# Patient Record
Sex: Male | Born: 1935 | Race: White | Hispanic: No | Smoking: Never smoker
Health system: Southern US, Community
[De-identification: ages and names within clinical notes are randomized; demographics above are authoritative.]

## PROBLEM LIST (undated history)

## (undated) DIAGNOSIS — R32 Unspecified urinary incontinence: Secondary | ICD-10-CM

## (undated) DIAGNOSIS — J449 Chronic obstructive pulmonary disease, unspecified: Secondary | ICD-10-CM

## (undated) DIAGNOSIS — N400 Enlarged prostate without lower urinary tract symptoms: Secondary | ICD-10-CM

## (undated) DIAGNOSIS — Z9109 Other allergy status, other than to drugs and biological substances: Secondary | ICD-10-CM

## (undated) DIAGNOSIS — R413 Other amnesia: Secondary | ICD-10-CM

## (undated) DIAGNOSIS — I10 Essential (primary) hypertension: Secondary | ICD-10-CM

## (undated) HISTORY — PX: HERNIA REPAIR: SHX51

## (undated) HISTORY — PX: PROSTATECTOMY: SHX69

---

## 2011-10-20 ENCOUNTER — Emergency Department: Admit: 2011-10-20 | Discharge: 2011-10-20 | Disposition: A | Discharge status: Home / Self Care | Payer: Medicare Other

## 2011-10-20 ENCOUNTER — Emergency Department (INDEPENDENT_AMBULATORY_CARE_PROVIDER_SITE_OTHER)
Admission: EM | Admit: 2011-10-20 | Discharge: 2011-10-20 | Disposition: A | Discharge status: Home / Self Care | Payer: Medicare Other | Source: Home / Self Care | Attending: Emergency Medicine | Admitting: Emergency Medicine

## 2011-10-20 DIAGNOSIS — M25531 Pain in right wrist: Secondary | ICD-10-CM

## 2011-10-20 DIAGNOSIS — M25539 Pain in unspecified wrist: Secondary | ICD-10-CM

## 2011-10-20 HISTORY — DX: Other allergy status, other than to drugs and biological substances: Z91.09

## 2011-10-20 HISTORY — DX: Chronic obstructive pulmonary disease, unspecified: J44.9

## 2011-10-20 HISTORY — DX: Essential (primary) hypertension: I10

## 2011-10-20 MED ORDER — CEPHALEXIN 500 MG PO CAPS: 500.0000 mg | ORAL_CAPSULE | Freq: Three times a day (TID) | ORAL | Status: AC

## 2011-10-20 NOTE — ED Provider Notes (Signed)
History     CSN: [id number]  Arrival date & time 10/20/11  1200   First MD Initiated Contact with Patient 10/20/11 1243      Chief Complaint  Patient presents with  . Hand Pain    (Consider location/radiation/quality/duration/timing/severity/associated sxs/prior treatment) HPI This is a right-handed 78 year old male with a history of osteoarthritis who comes in complaining of right wrist pain, swelling, and redness. His daughter is present with him. He does not recall any injuries or falls. He does not recall any bites or other trauma. He states that he has always had some arthritis pain in his fingers and other joints. However this swelling has increased over the last 2 days. He is taking Tylenol which is helping. He describes as sore and stiff.  No fever, chills, N/V.  Past Medical History  Diagnosis Date  . COPD (chronic obstructive pulmonary disease)   . Hypertension   . Environmental allergies     No past surgical history on file.  No family history on file.  History  Substance Use Topics  . Smoking status: Not on file  . Smokeless tobacco: Not on file  . Alcohol Use:       Review of Systems  All other systems reviewed and are negative.    Allergies  Tetanus toxoids  Home Medications   Current Outpatient Rx  Name Route Sig Dispense Refill  . IPRATROPIUM-ALBUTEROL 18-103 MCG/ACT IN AERO Inhalation Inhale 2 puffs into the lungs every 6 (six) hours as needed.    Marland Kitchen AMITRIPTYLINE HCL 10 MG PO TABS Oral Take 10 mg by mouth at bedtime.    Marland Kitchen BRIMONIDINE TARTRATE 0.1 % OP SOLN      . FLUTICASONE-SALMETEROL 115-21 MCG/ACT IN AERO Inhalation Inhale 2 puffs into the lungs 2 (two) times daily.    Marland Kitchen MONTELUKAST SODIUM 10 MG PO TABS Oral Take 10 mg by mouth at bedtime.    . TRIAMTERENE-HCTZ 37.5-25 MG PO CAPS Oral Take 1 capsule by mouth every morning.    . CEPHALEXIN 500 MG PO CAPS Oral Take 1 capsule (500 mg total) by mouth 3 (three) times daily. 21 capsule 0    BP  178/78  Pulse 83  Temp(Src) 97.8 F (36.6 C) (Oral)  Resp 24  Ht 4\' 11"  (1.499 m)  Wt 148 lb 8 oz (67.359 kg)  BMI 29.99 kg/m2  SpO2 94%  Physical Exam  Nursing note and vitals reviewed. Constitutional: He is oriented to person, place, and time. He appears well-developed and well-nourished.  HENT:  Head: Normocephalic and atraumatic.  Eyes: No scleral icterus.  Neck: Neck supple.  Cardiovascular: Regular rhythm and normal heart sounds.   Pulmonary/Chest: Effort normal and breath sounds normal. No respiratory distress.  Musculoskeletal:       Right wrist examination demonstrates full range of motion with flexion, extension, supination and pronation. He does have some warmth and mild swelling and tenderness at the wrist joint itself. No snuffbox tenderness. No deformity seen. Distal neurovascular status is intact. He does have some osteoarthritic bony changes in that hand.  Neurological: He is alert and oriented to person, place, and time.  Skin: Skin is warm and dry.  Psychiatric: He has a normal mood and affect. His speech is normal.    ED Course  Procedures (including critical care time)  Labs Reviewed - No data to display Dg Wrist Complete Right  10/20/2011  *RADIOLOGY REPORT*  Clinical Data: 78 year old male with right wrist pain and swelling.  Limited movement.  RIGHT WRIST - COMPLETE 3+ VIEW  Comparison: None  Findings: There is no evidence of acute fracture, subluxation or dislocation. Degenerative changes at the radiocarpal joint and scaphoid- trapezium articulation noted. Moderate to heavy chondrocalcinosis identified within the wrist, triangular fibrocartilage and at the first carpometacarpal joint. There is no evidence of bony erosion.  IMPRESSION: No evidence of acute abnormality.  Chondrocalcinosis/CPPD.  Degenerative changes within the wrist as described.  Original Report Authenticated By: Rosendo Gros, M.D.     1. Right wrist pain       MDM   An x-ray is  obtained and read by the radiologist as above.  Differential diagnosis includes gout, cellulitis, or other injury. To be safe at his age, I'm going to give him a prescription for Keflex. I would like him to followup with his primary care physician this week. If it is getting worse in terms of joint stiffness or redness, he may need to go see an orthopedist instead.  Encourage rest, ice, compression with ACE bandage and/or a brace, and elevation of injured body part.  At his age, I prefer Tylenol and avoid NSAIDs.       Marlaine Hind, MD 10/20/11 1335

## 2011-10-20 NOTE — ED Notes (Signed)
Right hand pain and swelling started Friday night

## 2012-03-08 ENCOUNTER — Emergency Department (HOSPITAL_COMMUNITY): Payer: Medicare Other

## 2012-03-08 ENCOUNTER — Emergency Department (HOSPITAL_COMMUNITY)
Admission: EM | Admit: 2012-03-08 | Discharge: 2012-03-08 | Disposition: A | Discharge status: Home / Self Care | Payer: Medicare Other | Attending: Emergency Medicine | Admitting: Emergency Medicine

## 2012-03-08 ENCOUNTER — Encounter (HOSPITAL_COMMUNITY): Payer: Self-pay | Admitting: *Deleted

## 2012-03-08 DIAGNOSIS — R109 Unspecified abdominal pain: Secondary | ICD-10-CM | POA: Insufficient documentation

## 2012-03-08 DIAGNOSIS — J4489 Other specified chronic obstructive pulmonary disease: Secondary | ICD-10-CM | POA: Insufficient documentation

## 2012-03-08 DIAGNOSIS — N419 Inflammatory disease of prostate, unspecified: Secondary | ICD-10-CM | POA: Insufficient documentation

## 2012-03-08 DIAGNOSIS — I10 Essential (primary) hypertension: Secondary | ICD-10-CM | POA: Insufficient documentation

## 2012-03-08 DIAGNOSIS — Z9109 Other allergy status, other than to drugs and biological substances: Secondary | ICD-10-CM | POA: Insufficient documentation

## 2012-03-08 DIAGNOSIS — J449 Chronic obstructive pulmonary disease, unspecified: Secondary | ICD-10-CM | POA: Insufficient documentation

## 2012-03-08 DIAGNOSIS — R509 Fever, unspecified: Secondary | ICD-10-CM | POA: Insufficient documentation

## 2012-03-08 DIAGNOSIS — R110 Nausea: Secondary | ICD-10-CM | POA: Insufficient documentation

## 2012-03-08 DIAGNOSIS — Z79899 Other long term (current) drug therapy: Secondary | ICD-10-CM | POA: Insufficient documentation

## 2012-03-08 LAB — LACTIC ACID, PLASMA: Lactic Acid, Venous: 1.6 mmol/L (ref 0.5–2.2)

## 2012-03-08 LAB — CBC WITH DIFFERENTIAL/PLATELET
Basophils Absolute: 0 10*3/uL (ref 0.0–0.1)
Basophils Relative: 0 % (ref 0–1)
HCT: 43.7 % (ref 39.0–52.0)
Hemoglobin: 15.2 g/dL (ref 13.0–17.0)
Lymphocytes Relative: 3 % — ABNORMAL LOW (ref 12–46)
MCHC: 34.8 g/dL (ref 30.0–36.0)
Monocytes Absolute: 0.9 10*3/uL (ref 0.1–1.0)
Monocytes Relative: 7 % (ref 3–12)
Neutro Abs: 11.6 10*3/uL — ABNORMAL HIGH (ref 1.7–7.7)
Neutrophils Relative %: 90 % — ABNORMAL HIGH (ref 43–77)
RBC: 4.87 MIL/uL (ref 4.22–5.81)
WBC: 12.9 10*3/uL — ABNORMAL HIGH (ref 4.0–10.5)

## 2012-03-08 LAB — URINALYSIS, ROUTINE W REFLEX MICROSCOPIC
Glucose, UA: NEGATIVE mg/dL
Leukocytes, UA: NEGATIVE
Nitrite: NEGATIVE
Specific Gravity, Urine: 1.012 (ref 1.005–1.030)
pH: 7.5 (ref 5.0–8.0)

## 2012-03-08 LAB — POCT I-STAT, CHEM 8
BUN: 16 mg/dL (ref 6–23)
Calcium, Ion: 1.19 mmol/L (ref 1.13–1.30)
Chloride: 101 mEq/L (ref 96–112)
Glucose, Bld: 143 mg/dL — ABNORMAL HIGH (ref 70–99)
HCT: 47 % (ref 39.0–52.0)
Potassium: 3.5 mEq/L (ref 3.5–5.1)

## 2012-03-08 MED ORDER — SODIUM CHLORIDE 0.9 % IV BOLUS (SEPSIS)
500.0000 mL | Freq: Once | INTRAVENOUS | Status: AC
  Administered 2012-03-08: 500 mL via INTRAVENOUS

## 2012-03-08 MED ORDER — ACETAMINOPHEN 325 MG PO TABS
650.0000 mg | ORAL_TABLET | Freq: Once | ORAL | Status: AC
  Administered 2012-03-08: 650 mg via ORAL
  Filled 2012-03-08: qty 2

## 2012-03-08 MED ORDER — DEXTROSE 5 % IV SOLN
1.0000 g | Freq: Once | INTRAVENOUS | Status: AC
  Administered 2012-03-08: 1 g via INTRAVENOUS
  Filled 2012-03-08: qty 10

## 2012-03-08 MED ORDER — CEPHALEXIN 500 MG PO CAPS: 500.0000 mg | ORAL_CAPSULE | Freq: Four times a day (QID) | ORAL | Status: AC

## 2012-03-08 NOTE — ED Notes (Signed)
Daughter advises that he has mild incontinence of bowel and bladder, last BM 03/07/2012. Feeling like he needs to void   with little output

## 2012-03-08 NOTE — ED Notes (Signed)
Daughter at bedside patient denies pain at this time.

## 2012-03-08 NOTE — ED Provider Notes (Signed)
Patient is moved to the CDU after initial evaluation by EDP Wickline. He presented to the ER earlier with lower abdominal pain and fever up to 100.5 as well as urinary frequency with decreased output and decreased activity.  On my exam, patient is awake alert and in no distress. Course breath sounds. Regular rate and rhythm. Abdomen is soft, nontender. Moves all extremities well.  Per initial provider, prostatitis suspected as cause of symptoms. Has had good improvement after placement of foley catheter. Afebrile without tachycardia on VS recheck.  Pt is ambulatory in ED with minimal assistance, at baseline per daughter. He appears ready for d/c home. Foley catheter will remain in place. Keflex will be given per discussion with EDP and pt is advised to f/u with urology and PCP. Return precautions discussed.  Shaaron Adler, [place] 03/08/12 1958

## 2012-03-08 NOTE — ED Notes (Signed)
Pt ambulatory with standby assistance, Witnessed by PA.

## 2012-03-08 NOTE — ED Notes (Signed)
Brought ion to D 36. Placed on stretcher

## 2012-03-08 NOTE — ED Notes (Signed)
Patient with reported decreased urine output.  He reports onset of lower abd pain that is worse today with fever and shakiness.  He is also reporting nausea.  Patient has fever in triage of 100.0

## 2012-03-08 NOTE — ED Notes (Signed)
Foley Cath inserted per Mosetta Anis Urine is clear yellow and more than 200 cc out initially from [facility]

## 2012-03-08 NOTE — ED Notes (Signed)
As per Foley 500cc of urine

## 2012-03-08 NOTE — ED Provider Notes (Addendum)
History     CSN: [id number]  Arrival date & time 03/08/12  1406   First MD Initiated Contact with Patient 03/08/12 1511      Chief Complaint  Patient presents with  . Abdominal Pain  . Fever  . Nausea  . Urinary Retention     Patient is a 78 y.o. male presenting with abdominal pain. The history is provided by the patient and a relative.  Abdominal Pain The primary symptoms of the illness include abdominal pain and fever. Episode onset: today. The onset of the illness was gradual.  Additional symptoms associated with the illness include chills. Symptoms associated with the illness do not include back pain.  nothing improves symptoms, palpation worsens symptoms  Pt presents with lower abdominal pain and fever up to 100.5 today.  Patient is present with daughter.  She reports he is usually active and ambulatory but today has had fever and not wanting to get around.  He has had urinary frequency for past week with minimal urine output.  No cough noted.  No vomiting/diarrhea/constipation reported.    Past Medical History  Diagnosis Date  . COPD (chronic obstructive pulmonary disease)   . Hypertension   . Environmental allergies     Past Surgical History  Procedure Date  . Prostatectomy   . Hernia repair     No family history on file.  History  Substance Use Topics  . Smoking status: Never Smoker   . Smokeless tobacco: Not on file  . Alcohol Use: No      Review of Systems  Constitutional: Positive for fever and chills.  Gastrointestinal: Positive for abdominal pain.  Musculoskeletal: Negative for back pain.  Neurological: Positive for weakness.  All other systems reviewed and are negative.    Allergies  Tetanus toxoids  Home Medications   Current Outpatient Rx  Name Route Sig Dispense Refill  . IPRATROPIUM-ALBUTEROL 18-103 MCG/ACT IN AERO Inhalation Inhale 2 puffs into the lungs every 6 (six) hours as needed.    Marland Kitchen AMITRIPTYLINE HCL 10 MG PO TABS Oral Take  10 mg by mouth at bedtime.    Marland Kitchen BRIMONIDINE TARTRATE 0.1 % OP SOLN      . FLUTICASONE-SALMETEROL 115-21 MCG/ACT IN AERO Inhalation Inhale 2 puffs into the lungs 2 (two) times daily.    Marland Kitchen MONTELUKAST SODIUM 10 MG PO TABS Oral Take 10 mg by mouth at bedtime.    . TRIAMTERENE-HCTZ 37.5-25 MG PO CAPS Oral Take 1 capsule by mouth every morning.      BP 137/68  Pulse 106  Temp 100 F (37.8 C) (Oral)  Resp 24  SpO2 91%  Physical Exam CONSTITUTIONAL: Well developed/well nourished HEAD AND FACE: Normocephalic/atraumatic EYES: EOMI ENMT: Mucous membranes dry NECK: supple no meningeal signs SPINE:entire spine nontender, No bruising/crepitance/stepoffs noted to spine CV: S1/S2 noted, no murmurs/rubs/gallops noted LUNGS: coarse BS noted in the bases but no distress ABDOMEN: soft, mild suprapubic tenderness noted, no rebound or guarding GU:no cva tenderness.  No hernia noted, no testicular tenderness Rectal - prostate tender to palpation, chaperone present.  Normal rectal tone noted NEURO: Pt is awake/alert, moves all extremitiesx4 EXTREMITIES: pulses normal, full ROM SKIN: warm, color normal PSYCH: no abnormalities of mood noted  ED Course  Procedures Labs Reviewed  URINALYSIS, ROUTINE W REFLEX MICROSCOPIC - Abnormal; Notable for the following:    Hgb urine dipstick SMALL (*)     All other components within normal limits  CBC WITH DIFFERENTIAL - Abnormal; Notable for the following:  WBC 12.9 (*)     Neutrophils Relative 90 (*)     Neutro Abs 11.6 (*)     Lymphocytes Relative 3 (*)     Lymphs Abs 0.4 (*)     All other components within normal limits  POCT I-STAT, CHEM 8 - Abnormal; Notable for the following:    Glucose, Bld 143 (*)     All other components within normal limits  URINE MICROSCOPIC-ADD ON  LACTIC ACID, PLASMA  URINE CULTURE   3:53 PM Pt presents with lower abdominal pain, fever today and urinary frequency.  It is reported he had prostatectomy, but daughter thinks  it was partially removed.  He seems to have some prostate left on exam and he is tender.  Will obtain post void residual.    4:12 PM Pt currently stable.  Will hold in CDU to wait on CXR results, and also see if pain resolves.  If no resolution, will need CT imaging.  Suspect prostatitis Pt with >770ml of urine out foley placement  MDM  Nursing notes including past medical history and social history reviewed and considered in documentation labs/vitals reviewed and considered    Date: 03/08/2012  Rate: 79  Rhythm: normal sinus rhythm  QRS Axis: left  Intervals: normal  ST/T Wave abnormalities: nonspecific ST changes  Conduction Disutrbances:nonspecific intraventricular conduction delay  Narrative Interpretation:   Old EKG Reviewed: none available at time of interpretation        Joya Gaskins, MD 03/08/12 1614  Joya Gaskins, MD 03/08/12 1630

## 2012-03-08 NOTE — ED Notes (Signed)
Patient advises pain all night in the abdomen constant in nature.He denies nausea vomiting diarrhea

## 2012-03-09 NOTE — ED Provider Notes (Signed)
Medical screening examination/treatment/procedure(s) were conducted as a shared visit with non-physician practitioner(s) and myself.  I personally evaluated the patient during the encounter   Joya Gaskins, MD 03/09/12 1504

## 2012-03-10 LAB — URINE CULTURE: Colony Count: 25000

## 2012-09-01 ENCOUNTER — Ambulatory Visit: Payer: Medicare Other | Attending: Family Medicine

## 2012-09-01 DIAGNOSIS — IMO0001 Reserved for inherently not codable concepts without codable children: Secondary | ICD-10-CM | POA: Insufficient documentation

## 2012-09-01 DIAGNOSIS — J4489 Other specified chronic obstructive pulmonary disease: Secondary | ICD-10-CM | POA: Insufficient documentation

## 2012-09-01 DIAGNOSIS — R269 Unspecified abnormalities of gait and mobility: Secondary | ICD-10-CM | POA: Insufficient documentation

## 2012-09-01 DIAGNOSIS — J449 Chronic obstructive pulmonary disease, unspecified: Secondary | ICD-10-CM | POA: Insufficient documentation

## 2012-09-01 DIAGNOSIS — R262 Difficulty in walking, not elsewhere classified: Secondary | ICD-10-CM | POA: Insufficient documentation

## 2012-09-01 DIAGNOSIS — M6281 Muscle weakness (generalized): Secondary | ICD-10-CM | POA: Insufficient documentation

## 2012-09-01 DIAGNOSIS — I10 Essential (primary) hypertension: Secondary | ICD-10-CM | POA: Insufficient documentation

## 2012-09-07 ENCOUNTER — Encounter: Payer: Medicare Other | Admitting: Physical Therapy

## 2012-09-10 ENCOUNTER — Ambulatory Visit: Payer: Medicare Other | Admitting: Physical Therapy

## 2012-11-18 ENCOUNTER — Emergency Department (HOSPITAL_BASED_OUTPATIENT_CLINIC_OR_DEPARTMENT_OTHER)
Admission: EM | Admit: 2012-11-18 | Discharge: 2012-11-18 | Disposition: A | Discharge status: Home / Self Care | Payer: Medicare Other | Attending: Emergency Medicine | Admitting: Emergency Medicine

## 2012-11-18 ENCOUNTER — Encounter (HOSPITAL_BASED_OUTPATIENT_CLINIC_OR_DEPARTMENT_OTHER): Payer: Self-pay

## 2012-11-18 DIAGNOSIS — Z79899 Other long term (current) drug therapy: Secondary | ICD-10-CM | POA: Insufficient documentation

## 2012-11-18 DIAGNOSIS — I10 Essential (primary) hypertension: Secondary | ICD-10-CM | POA: Insufficient documentation

## 2012-11-18 DIAGNOSIS — Y9210 Unspecified residential institution as the place of occurrence of the external cause: Secondary | ICD-10-CM | POA: Insufficient documentation

## 2012-11-18 DIAGNOSIS — Y9301 Activity, walking, marching and hiking: Secondary | ICD-10-CM | POA: Insufficient documentation

## 2012-11-18 DIAGNOSIS — IMO0002 Reserved for concepts with insufficient information to code with codable children: Secondary | ICD-10-CM | POA: Insufficient documentation

## 2012-11-18 DIAGNOSIS — J4489 Other specified chronic obstructive pulmonary disease: Secondary | ICD-10-CM | POA: Insufficient documentation

## 2012-11-18 DIAGNOSIS — Z87448 Personal history of other diseases of urinary system: Secondary | ICD-10-CM | POA: Insufficient documentation

## 2012-11-18 DIAGNOSIS — S0101XA Laceration without foreign body of scalp, initial encounter: Secondary | ICD-10-CM

## 2012-11-18 DIAGNOSIS — S0100XA Unspecified open wound of scalp, initial encounter: Secondary | ICD-10-CM | POA: Insufficient documentation

## 2012-11-18 DIAGNOSIS — J449 Chronic obstructive pulmonary disease, unspecified: Secondary | ICD-10-CM | POA: Insufficient documentation

## 2012-11-18 DIAGNOSIS — W1809XA Striking against other object with subsequent fall, initial encounter: Secondary | ICD-10-CM | POA: Insufficient documentation

## 2012-11-18 DIAGNOSIS — Z8709 Personal history of other diseases of the respiratory system: Secondary | ICD-10-CM | POA: Insufficient documentation

## 2012-11-18 DIAGNOSIS — W010XXA Fall on same level from slipping, tripping and stumbling without subsequent striking against object, initial encounter: Secondary | ICD-10-CM | POA: Insufficient documentation

## 2012-11-18 HISTORY — DX: Benign prostatic hyperplasia without lower urinary tract symptoms: N40.0

## 2012-11-18 HISTORY — DX: Unspecified urinary incontinence: R32

## 2012-11-18 HISTORY — DX: Other amnesia: R41.3

## 2012-11-18 NOTE — ED Notes (Signed)
Pt reports he tripped on step and struck head on door-denies pain and LOC-dsg removed-5cm lac noted-no bleeding at present-cleaned with saline

## 2012-11-18 NOTE — ED Notes (Signed)
[facility] report-pt was walking into restaurant-tripped over step-stuck head on glass door-head lac noted-dsg applied-pt denies pain to head, neck and denied LOC-daughter was with pt at scene-pt lives in an assisted living facility

## 2012-11-18 NOTE — ED Notes (Signed)
MD at bedside. 

## 2012-11-19 NOTE — ED Provider Notes (Signed)
History     CSN: [id number]  Arrival date & time 11/18/12  1305   First MD Initiated Contact with Patient 11/18/12 1328      Chief Complaint  Patient presents with  . Head Injury    (Consider location/radiation/quality/duration/timing/severity/associated sxs/prior treatment) Patient is a 79 y.o. male presenting with head injury. The history is provided by the patient (Pt's daughter). No language interpreter was used.  Head Injury Head/neck injury location: Pt tripped and fell, striking his head on a door and suffering a scalp laceration.  He was not rendered unconsciuous.  There was no other injury. Time since incident:  1 hour Mechanism of injury: fall   Pain details:    Quality: Minimal pain. Relieved by:  Nothing Worsened by:  Nothing tried Associated symptoms: no focal weakness, no nausea, no neck pain, no seizures and no vomiting   Risk factors: being elderly   Risk factors comment:  Not on coumadin or other anticoagulant medications.   Past Medical History  Diagnosis Date  . COPD (chronic obstructive pulmonary disease)   . Hypertension   . Environmental allergies   . BPH (benign prostatic hyperplasia)   . Memory loss   . Urinary incontinence     Past Surgical History  Procedure Laterality Date  . Prostatectomy    . Hernia repair      No family history on file.  History  Substance Use Topics  . Smoking status: Never Smoker   . Smokeless tobacco: Not on file  . Alcohol Use: No      Review of Systems  Constitutional: Negative.  Negative for fever and chills.  HENT: Negative.  Negative for neck pain.   Eyes: Negative.   Respiratory: Negative.   Cardiovascular: Negative.   Gastrointestinal: Negative.  Negative for nausea and vomiting.  Genitourinary: Negative.   Musculoskeletal: Negative.   Skin: Positive for wound.  Neurological: Negative.  Negative for focal weakness and seizures.  Psychiatric/Behavioral: Negative.     Allergies  Tetanus  toxoids  Home Medications   Current Outpatient Rx  Name  Route  Sig  Dispense  Refill  . acetaminophen (TYLENOL) 500 MG tablet   Oral   Take 500 mg by mouth every 6 (six) hours as needed. For joint pain         . albuterol-ipratropium (COMBIVENT) 18-103 MCG/ACT inhaler   Inhalation   Inhale 2 puffs into the lungs every 6 (six) hours as needed.         Marland Kitchen amitriptyline (ELAVIL) 10 MG tablet   Oral   Take 10 mg by mouth at bedtime.         Marland Kitchen amoxicillin (AMOXIL) 500 MG capsule   Oral   Take 2,000 mg by mouth daily as needed. 1 hour prior to dental appointment         . brimonidine (ALPHAGAN P) 0.1 % SOLN   Right Eye   Place 1 drop into the right eye every 8 (eight) hours.          . Fluticasone-Salmeterol (ADVAIR) 250-50 MCG/DOSE AEPB   Inhalation   Inhale 1 puff into the lungs every 12 (twelve) hours.         . montelukast (SINGULAIR) 10 MG tablet   Oral   Take 10 mg by mouth at bedtime.         . Multiple Vitamin (MULTIVITAMIN WITH MINERALS) TABS   Oral   Take 1 tablet by mouth daily.         Marland Kitchen  triamterene-hydrochlorothiazide (MAXZIDE-25) 37.5-25 MG per tablet   Oral   Take 0.5 tablets by mouth daily.           BP 167/63  Pulse 78  Temp(Src) 98.4 F (36.9 C) (Oral)  Resp 16  Ht 5\' 8"  (1.727 m)  Wt 145 lb (65.772 kg)  BMI 22.05 kg/m2  SpO2 96%  Physical Exam  Nursing note and vitals reviewed. Constitutional: He is oriented to person, place, and time.  Slender elderly man with scalp laceration, in no distress.  HENT:  Right Ear: External ear normal.  Left Ear: External ear normal.  Mouth/Throat: Oropharynx is clear and moist.  He has an L-shaped flap laceration on the vertex of his skull, 8 cm (measured) long, extending into the subcutaneous tissue.  A small 2 cm area of skull was exposed; there was no skull fracture visible or palpable.  No foreign body in the wound.  Eyes: Conjunctivae and EOM are normal. Pupils are equal, round, and  reactive to light.  Neck: Normal range of motion. Neck supple.  No bony deformity or point tenderness over the cervical spine.  Cardiovascular: Normal rate, regular rhythm and normal heart sounds.   Pulmonary/Chest: Effort normal and breath sounds normal.  Significant kyphosis.  Abdominal: Soft. Bowel sounds are normal.  Musculoskeletal: Normal range of motion.  Thoracic kyphosis.  No palpable deformity.  Neurological: He is alert and oriented to person, place, and time.  Skin: Skin is warm and dry.  Psychiatric: He has a normal mood and affect. His behavior is normal.    ED Course  LACERATION REPAIR Date/Time: 11/19/2012 1:38 PM Performed by: Osvaldo Human Authorized by: Osvaldo Human Consent: Verbal consent obtained. Risks and benefits: risks, benefits and alternatives were discussed Consent given by: patient Patient understanding: patient states understanding of the procedure being performed Patient consent: the patient's understanding of the procedure matches consent given Site marked: the operative site was not marked Patient identity confirmed: verbally with patient Time out: Immediately prior to procedure a "time out" was called to verify the correct patient, procedure, equipment, support staff and site/side marked as required. Body area: head/neck Location details: scalp Laceration length: 8 cm Foreign bodies: no foreign bodies Tendon involvement: none Nerve involvement: none Vascular damage: no Anesthesia: local infiltration Local anesthetic: lidocaine 2% without epinephrine Patient sedated: no Preparation: Patient was prepped and draped in the usual sterile fashion. Irrigation solution: saline Amount of cleaning: standard Debridement: none Degree of undermining: none Skin closure: staples Number of sutures: 10 Technique: simple Approximation: loose Approximation difficulty: simple Patient tolerance: Patient tolerated the procedure well with no  immediate complications.   (including critical care time)  Staples out in appx 10 days.    1. Scalp laceration, initial encounter       Carleene Cooper III, MD 11/19/12 1340

## 2013-07-18 ENCOUNTER — Emergency Department (HOSPITAL_COMMUNITY): Payer: Medicare Other

## 2013-07-18 ENCOUNTER — Encounter (HOSPITAL_COMMUNITY): Payer: Self-pay | Admitting: Emergency Medicine

## 2013-07-18 ENCOUNTER — Emergency Department (HOSPITAL_COMMUNITY)
Admission: EM | Admit: 2013-07-18 | Discharge: 2013-07-19 | Disposition: A | Discharge status: Other Acute Inpatient Hospital | Payer: Medicare Other | Attending: Emergency Medicine | Admitting: Emergency Medicine

## 2013-07-18 DIAGNOSIS — Z87448 Personal history of other diseases of urinary system: Secondary | ICD-10-CM | POA: Insufficient documentation

## 2013-07-18 DIAGNOSIS — N400 Enlarged prostate without lower urinary tract symptoms: Secondary | ICD-10-CM | POA: Insufficient documentation

## 2013-07-18 DIAGNOSIS — J449 Chronic obstructive pulmonary disease, unspecified: Secondary | ICD-10-CM | POA: Insufficient documentation

## 2013-07-18 DIAGNOSIS — W010XXA Fall on same level from slipping, tripping and stumbling without subsequent striking against object, initial encounter: Secondary | ICD-10-CM | POA: Insufficient documentation

## 2013-07-18 DIAGNOSIS — Z8669 Personal history of other diseases of the nervous system and sense organs: Secondary | ICD-10-CM | POA: Insufficient documentation

## 2013-07-18 DIAGNOSIS — Z9109 Other allergy status, other than to drugs and biological substances: Secondary | ICD-10-CM | POA: Insufficient documentation

## 2013-07-18 DIAGNOSIS — S0191XA Laceration without foreign body of unspecified part of head, initial encounter: Secondary | ICD-10-CM

## 2013-07-18 DIAGNOSIS — Z9181 History of falling: Secondary | ICD-10-CM | POA: Insufficient documentation

## 2013-07-18 DIAGNOSIS — R509 Fever, unspecified: Secondary | ICD-10-CM | POA: Insufficient documentation

## 2013-07-18 DIAGNOSIS — Z888 Allergy status to other drugs, medicaments and biological substances status: Secondary | ICD-10-CM | POA: Insufficient documentation

## 2013-07-18 DIAGNOSIS — Z79899 Other long term (current) drug therapy: Secondary | ICD-10-CM | POA: Insufficient documentation

## 2013-07-18 DIAGNOSIS — I10 Essential (primary) hypertension: Secondary | ICD-10-CM | POA: Insufficient documentation

## 2013-07-18 DIAGNOSIS — Y9301 Activity, walking, marching and hiking: Secondary | ICD-10-CM | POA: Insufficient documentation

## 2013-07-18 DIAGNOSIS — R5381 Other malaise: Secondary | ICD-10-CM | POA: Insufficient documentation

## 2013-07-18 DIAGNOSIS — S0100XA Unspecified open wound of scalp, initial encounter: Secondary | ICD-10-CM | POA: Insufficient documentation

## 2013-07-18 DIAGNOSIS — W19XXXA Unspecified fall, initial encounter: Secondary | ICD-10-CM

## 2013-07-18 DIAGNOSIS — Y9210 Unspecified residential institution as the place of occurrence of the external cause: Secondary | ICD-10-CM | POA: Insufficient documentation

## 2013-07-18 DIAGNOSIS — IMO0002 Reserved for concepts with insufficient information to code with codable children: Secondary | ICD-10-CM | POA: Insufficient documentation

## 2013-07-18 DIAGNOSIS — J4489 Other specified chronic obstructive pulmonary disease: Secondary | ICD-10-CM | POA: Insufficient documentation

## 2013-07-18 DIAGNOSIS — N390 Urinary tract infection, site not specified: Secondary | ICD-10-CM

## 2013-07-18 LAB — CBC WITH DIFFERENTIAL/PLATELET
Basophils Absolute: 0 10*3/uL (ref 0.0–0.1)
Eosinophils Relative: 0 % (ref 0–5)
HCT: 40.4 % (ref 39.0–52.0)
Lymphs Abs: 0.5 10*3/uL — ABNORMAL LOW (ref 0.7–4.0)
MCH: 32.1 pg (ref 26.0–34.0)
MCV: 90.8 fL (ref 78.0–100.0)
Monocytes Absolute: 1.1 10*3/uL — ABNORMAL HIGH (ref 0.1–1.0)
Monocytes Relative: 9 % (ref 3–12)
Neutro Abs: 10.4 10*3/uL — ABNORMAL HIGH (ref 1.7–7.7)
Platelets: 166 10*3/uL (ref 150–400)
RDW: 14.1 % (ref 11.5–15.5)
WBC: 12 10*3/uL — ABNORMAL HIGH (ref 4.0–10.5)

## 2013-07-18 LAB — BASIC METABOLIC PANEL
BUN: 45 mg/dL — ABNORMAL HIGH (ref 6–23)
CO2: 23 mEq/L (ref 19–32)
Calcium: 8.8 mg/dL (ref 8.4–10.5)
Chloride: 97 mEq/L (ref 96–112)
Creatinine, Ser: 1.48 mg/dL — ABNORMAL HIGH (ref 0.50–1.35)

## 2013-07-18 MED ORDER — FENTANYL CITRATE 0.05 MG/ML IJ SOLN
25.0000 ug | Freq: Once | INTRAMUSCULAR | Status: AC
  Administered 2013-07-18: 25 ug via INTRAVENOUS
  Filled 2013-07-18: qty 2

## 2013-07-18 NOTE — ED Notes (Signed)
Dr. Arlie Solomons irrigating wound at this time after local anesthetic, pt tolerating well, daughter at [facility].

## 2013-07-18 NOTE — ED Notes (Signed)
Pt tolerated CT, pt in xray at this time, bleeding remains oozing, but controlled, fentanyl given for pain,pts c/o pain is constant, location of pain is inconsistent and scattered.

## 2013-07-18 NOTE — ED Notes (Addendum)
Pt arrives alert/arousable, oriented, NAD, calm, interactive, appropriate, cooperative, participatory, arrives by EMS to D33 with EDP resident Dr. Arlie Solomons present, here s/p fall and 12cm headlac, bleeding controlled by pressure dressing, no LOC, remembers fall. Denies neck or back pain, no spinal immobilization, no IV. VSS, LS CTA, MAEx4. Here from [facility].

## 2013-07-18 NOTE — ED Notes (Signed)
Pt attempted urine sample but was unsuccessful, dr Darrin Luis at bedside suturing pt laceration and stated not to worry about obtaining urine sample.

## 2013-07-18 NOTE — ED Notes (Signed)
No changes, Preparing for EPIC down time.

## 2013-07-18 NOTE — ED Provider Notes (Signed)
CSN: [id number]     Arrival date & time 07/18/13  2104 History   First MD Initiated Contact with Patient 07/18/13 2119     Chief Complaint  Patient presents with  . Fall  . Head Laceration   (Consider location/radiation/quality/duration/timing/severity/associated sxs/prior Treatment) HPI Comments: 79 year old male past medical history significant for COPD hypertension comes in status Bartholomew Ramesh mechanical fall. Patient was reportedly at nursing facility when he had a mechanical fall tripping falling forward and hitting head on table. Patient denies loss of consciousness. Per EMS and facility significant bleeding large head laceration right-sided parietal area. Has had multiple falls in several months. No report SOB, CP, LOC, confusion etc. although daughter states he has been more fatigued than usual recently. EMS also reports patient has temp 101   Patient is a 79 y.o. male presenting with fall and scalp laceration.  Fall This is a new problem. The current episode started today. The problem has been unchanged. Associated symptoms include fatigue. Pertinent negatives include no abdominal pain, chest pain, coughing, visual change, vomiting or weakness. Nothing aggravates the symptoms. He has tried nothing for the symptoms.  Head Laceration This is a new problem. The current episode started today. Associated symptoms include fatigue. Pertinent negatives include no abdominal pain, chest pain, coughing, visual change, vomiting or weakness.    Past Medical History  Diagnosis Date  . COPD (chronic obstructive pulmonary disease)   . Hypertension   . Environmental allergies   . BPH (benign prostatic hyperplasia)   . Memory loss   . Urinary incontinence    Past Surgical History  Procedure Laterality Date  . Prostatectomy    . Hernia repair     No family history on file. History  Substance Use Topics  . Smoking status: Never Smoker   . Smokeless tobacco: Not on file  . Alcohol Use: No     Review of Systems  Constitutional: Positive for fatigue.  Respiratory: Negative for cough.   Cardiovascular: Negative for chest pain.  Gastrointestinal: Negative for vomiting and abdominal pain.  Genitourinary: Negative for dysuria.  Neurological: Negative for weakness.  Psychiatric/Behavioral: Negative for confusion.  All other systems reviewed and are negative.    Allergies  Tetanus toxoids  Home Medications   Current Outpatient Rx  Name  Route  Sig  Dispense  Refill  . acetaminophen (TYLENOL) 500 MG tablet   Oral   Take 500 mg by mouth every 6 (six) hours as needed. For joint pain         . albuterol-ipratropium (COMBIVENT) 18-103 MCG/ACT inhaler   Inhalation   Inhale 2 puffs into the lungs every 6 (six) hours as needed.         Marland Kitchen amitriptyline (ELAVIL) 10 MG tablet   Oral   Take 10 mg by mouth at bedtime.         Marland Kitchen amoxicillin (AMOXIL) 500 MG capsule   Oral   Take 2,000 mg by mouth daily as needed. 1 hour prior to dental appointment         . fluconazole (DIFLUCAN) 100 MG tablet   Oral   Take 100 mg by mouth daily.         . Fluticasone-Salmeterol (ADVAIR) 250-50 MCG/DOSE AEPB   Inhalation   Inhale 1 puff into the lungs every 12 (twelve) hours.         Marland Kitchen guaifenesin (ROBITUSSIN) 100 MG/5ML syrup   Oral   Take 200 mg by mouth daily as needed for cough.         Marland Kitchen  meloxicam (MOBIC) 7.5 MG tablet   Oral   Take 7.5 mg by mouth daily.         . montelukast (SINGULAIR) 10 MG tablet   Oral   Take 10 mg by mouth at bedtime.         . sodium chloride (OCEAN) 0.65 % SOLN nasal spray   Each Nare   Place 1 spray into both nostrils daily as needed for congestion.         . triamterene-hydrochlorothiazide (MAXZIDE-25) 37.5-25 MG per tablet   Oral   Take 0.5 tablets by mouth daily.          BP 133/83  Pulse 44  Temp(Src) 100.2 F (37.9 C) (Oral)  Resp 22  SpO2 88% Physical Exam  Nursing note and vitals reviewed. Constitutional:  He is oriented to person, place, and time. He appears well-developed and well-nourished.  HENT:  Head: Normocephalic.  3-4 inch laceration right parietal area extending toward midline. Slight venous oozing. Violates galea, no palpable step-offs or deformities of the skull  Eyes: EOM are normal. Pupils are equal, round, and reactive to light.  Neck: Normal range of motion.  Cardiovascular: Normal rate, regular rhythm and intact distal pulses.   Pulmonary/Chest: Effort normal and breath sounds normal. No respiratory distress.  Abdominal: Soft. He exhibits no distension. There is no tenderness. There is no rebound and no guarding.  Musculoskeletal: Normal range of motion.  Thorough secondary exam shows no areas of tenderness deformities or injuries  Neurological: He is alert and oriented to person, place, and time. No cranial nerve deficit. He exhibits normal muscle tone. Coordination normal.  Skin: Skin is warm and dry. No rash noted.  Psychiatric: He has a normal mood and affect.    ED Course  LACERATION REPAIR Date/Time: 07/19/2013 12:04 AM Performed by: Hilding Quintanar Authorized by: Florinda Taflinger Consent: Verbal consent obtained. Risks and benefits: risks, benefits and alternatives were discussed Consent given by: patient and guardian Body area: head/neck Location details: scalp Laceration length: 8 cm Foreign bodies: no foreign bodies Tendon involvement: none Nerve involvement: none Anesthesia: local infiltration and nerve block Local anesthetic: lidocaine 1% without epinephrine Anesthetic total: 10 ml Irrigation solution: saline Irrigation method: jet lavage Amount of cleaning: standard Debridement: none Skin closure: 4-0 nylon Number of sutures: 11 Technique: simple Approximation: close Approximation difficulty: simple Dressing: 4x4 sterile gauze Patient tolerance: Patient tolerated the procedure well with no immediate complications.   (including critical care time) Labs  Review Labs Reviewed  CBC WITH DIFFERENTIAL - Abnormal; Notable for the following:    WBC 12.0 (*)    Neutrophils Relative % 87 (*)    Lymphocytes Relative 4 (*)    Neutro Abs 10.4 (*)    Lymphs Abs 0.5 (*)    Monocytes Absolute 1.1 (*)    All other components within normal limits  BASIC METABOLIC PANEL - Abnormal; Notable for the following:    Sodium 132 (*)    Glucose, Bld 117 (*)    BUN 45 (*)    Creatinine, Ser 1.48 (*)    GFR calc non Af Amer 38 (*)    GFR calc Af Amer 44 (*)    All other components within normal limits  URINALYSIS, ROUTINE W REFLEX MICROSCOPIC   Imaging Review Dg Chest 1 View  07/18/2013   CLINICAL DATA:  Shortness of breath and fall.  EXAM: CHEST - 1 VIEW  COMPARISON:  Chest radiograph March 08, 2012.  FINDINGS: Cardiac silhouette appears moderately  enlarged, even with consideration to this low inspiratory portable examination with crowded vasculature markings. Mediastinal silhouette is nonsuspicious. No pleural effusions or focal consolidations. No pneumothorax. The right lung apex is obscured by facial structures.  Multiple EKG lines overlie the patient and may obscure subtle underlying pathology. Mild scoliosis and degenerative change of the spine. Possible nondisplaced right lateral 8th rib fracture, recommend correlation with point tenderness.  IMPRESSION: Stable cardiomegaly, no acute pulmonary process.  Possible nondisplaced right lateral 8th rib fracture, recommend correlation with point tenderness.   Electronically Signed   By: Awilda Metro   On: 07/18/2013 22:59   Ct Head Wo Contrast  07/18/2013   CLINICAL DATA:  Status Alynna Hargrove fall; head laceration.  EXAM: CT HEAD WITHOUT CONTRAST  TECHNIQUE: Contiguous axial images were obtained from the base of the skull through the vertex without intravenous contrast.  COMPARISON:  None.  FINDINGS: There is no evidence of acute infarction, mass lesion, or intra- or extra-axial hemorrhage on CT.  Prominence of the  ventricles and sulci reflects mild to moderate cortical volume loss. Mild cerebellar atrophy is noted. Scattered periventricular and subcortical white matter change likely reflects mild small vessel ischemic microangiopathy.  The posterior fossa, including the cerebellum, brainstem and fourth ventricle, is within normal limits. The third and lateral ventricles, and basal ganglia are unremarkable in appearance. The cerebral hemispheres are symmetric in appearance, with normal gray-white differentiation. No mass effect or midline shift is seen.  There is no evidence of fracture; visualized osseous structures are unremarkable in appearance. The orbits are within normal limits. There is partial opacification of the right ethmoid air cells, and opacification of the right frontal sinus and right side of the sphenoid sinus, with associated mucoperiosteal thickening. The remaining paranasal sinuses and mastoid air cells are well-aerated. A prominent soft tissue laceration is noted anteriorly near the vertex.  IMPRESSION: 1. No evidence of traumatic intracranial injury or fracture. 2. Prominent soft tissue laceration anteriorly near the vertex. 3. Mild to moderate cortical volume loss and mild small vessel ischemic microangiopathy. 4. Opacification of the right frontal sinus and right side of the sphenoid sinus, with associated mucoperiosteal thickening.   Electronically Signed   By: Roanna Raider M.D.   On: 07/18/2013 22:58    EKG Interpretation    Date/Time:  Sunday July 18 2013 22:56:00 EST Ventricular Rate:  107 PR Interval:    QRS Duration: 130 QT Interval:  405 QTC Calculation: 540 R Axis:   -40 Text Interpretation:  Sinus rhythm Ventricular premature complex LVH with IVCD, LAD and secondary repol abnrm Anterior ST elevation, probably due to LVH Borderline prolonged QT interval Confirmed by STEINL  MD, KEVIN ([id number]) on 07/18/2013 11:01:03 PM            MDM   1. Fall, initial encounter   2.  Laceration of head, initial encounter     79  year old male status Laith Antonelli fall. Patient with approximately 4 inch laceration across scalp. Denies loss of consciousness. Family bedside states he is at baseline. No concerning findings on neurological exam. Wound was irrigated and closed per procedure note. CT head showed no intracranial abnormalities. Per patient report, as well as EMS, this is a mechanical fall. There is no report of loss of consciousness, syncope, chest pain, shortness of breath. Although family bedside does state patient is been more fatigued lately and they were concerned for possible infection. Plan visit PCP tomorrow for this. However to ensure no serious infection contributing to fall obtained labs, CXR  and UA. CBC showed very slight leukocytosis. Chest x-ray within normal limits. BMP showed very slight elevation of creatinine to 1.4. Discussed with family and patient. Will increase by mouth fluid intake. PCP appointment tomorrow we'll recheck and followup. UA pending. At 12:30am care transferred to [facility]. Plan to d/c to facility +/- antibiotics for UTI pending UA results and re-eval.    Bridgett Larsson, MD 07/19/13 [phone]

## 2013-07-18 NOTE — ED Notes (Signed)
SPO2 remains 98% on RA c/w EMS, daughter reports pt c/o recent sickness/illness, "not feeling well, was c/o sob, increased confusion, more today, earlier today, this afternoon, I was going to take him to see his PCP tomorrow", pt usually walks around the facility with a cane, oriented at this time to person and in a hospital somewhere, knows why he is here (fell). arousable and interactive, needs some prodding.

## 2013-07-18 NOTE — ED Notes (Signed)
Pt taken to CT/xray by RN, pain med ordered per Dr. Denton Lank (t.o.)

## 2013-07-19 LAB — URINALYSIS, ROUTINE W REFLEX MICROSCOPIC
Bilirubin Urine: NEGATIVE
Glucose, UA: NEGATIVE mg/dL
Protein, ur: 30 mg/dL — AB
Urobilinogen, UA: 0.2 mg/dL (ref 0.0–1.0)

## 2013-07-19 LAB — URINE MICROSCOPIC-ADD ON

## 2013-07-19 MED ORDER — CEPHALEXIN 250 MG PO CAPS
500.0000 mg | ORAL_CAPSULE | Freq: Once | ORAL | Status: AC
  Administered 2013-07-19: 500 mg via ORAL
  Filled 2013-07-19: qty 2

## 2013-07-19 MED ORDER — CEPHALEXIN 500 MG PO TABS: 500.0000 mg | ORAL_TABLET | Freq: Four times a day (QID) | ORAL | Status: DC

## 2013-07-19 NOTE — ED Provider Notes (Signed)
Pt received from Dr. Gareth Eagle.  U/A positive for infection.  Results discussed w/ pt and his daughter.  He received first dose of Keflex in ED.   Patient stable at time of discharge.  HR checked manually and was irregular and 80bmp.    Curtis Sabina Shaliyah Taite, PA-C 07/19/13 1430

## 2013-07-19 NOTE — ED Notes (Signed)
Report given to & d/c instructions explained to: PTAR, Joselyn Arrow Gala Murdoch) and daughter at [facility]. Denies questions. No changes. Pt alert, NAD, calm, interactive. EDPA CS, PA in to see pt at time of d/c.

## 2013-07-19 NOTE — ED Notes (Signed)
Pt with alternating weak and strong pulse, irregular, pt asymptomatic, monitor not picking up every beat.

## 2013-07-20 NOTE — ED Provider Notes (Signed)
Medical screening examination/treatment/procedure(s) were conducted as a shared visit with non-physician practitioner(s) and myself.  I personally evaluated the patient during the encounter.  EKG Interpretation    Date/Time:  Sunday July 18 2013 22:56:00 EST Ventricular Rate:  107 PR Interval:  181 QRS Duration: 130 QT Interval:  405 QTC Calculation: 540 R Axis:   -40 Text Interpretation:  Sinus rhythm Ventricular premature complex LVH with IVCD, LAD and secondary repol abnrm Anterior ST elevation, probably due to LVH Borderline prolonged QT interval Confirmed by Denton Lank  MD, Maximo Spratling ([id number]) on 07/18/2013 11:01:03 PM              Suzi Roots, MD 07/20/13 [phone]

## 2013-07-20 NOTE — ED Provider Notes (Signed)
I saw and evaluated the patient, reviewed the resident's note and I agree with the findings and plan.  EKG Interpretation    Date/Time:  Sunday July 18 2013 22:56:00 EST Ventricular Rate:  107 PR Interval:  181 QRS Duration: 130 QT Interval:  405 QTC Calculation: 540 R Axis:   -40 Text Interpretation:  Sinus rhythm Ventricular premature complex LVH with IVCD, LAD and secondary repol abnrm Anterior ST elevation, probably due to LVH Borderline prolonged QT interval Confirmed by Denton Lank  MD, Deanna Wiater ([id number]) on 07/18/2013 11:01:03 PM            Pt s/p mechanical fall, no loc.   Large scalp wound.   Laceration repair note:  10 cm scalp laceration, flap.  Verbal consent obtained Locally anesthetized with 2% lid w epi. Sterile prep and drap, sterile technique. Irrigated extremely well w sterile saline.  Closed w 4-0 prolene, interrupted sutures, 16 sutures placed.  Pressure dressing for 30 minutes post procedure. Pressure dressing removed, wound subsequently rechecked. No accumulation of hematoma.  Sterile dressing.    Labs.   Recheck awake and alert. No new c/o. Spine nt.    Suzi Roots, MD 07/20/13 [phone]

## 2013-07-21 LAB — URINE CULTURE

## 2013-10-03 ENCOUNTER — Encounter: Payer: Self-pay | Admitting: Emergency Medicine

## 2013-10-03 ENCOUNTER — Emergency Department (INDEPENDENT_AMBULATORY_CARE_PROVIDER_SITE_OTHER): Payer: Medicare Other

## 2013-10-03 ENCOUNTER — Emergency Department (INDEPENDENT_AMBULATORY_CARE_PROVIDER_SITE_OTHER)
Admission: EM | Admit: 2013-10-03 | Discharge: 2013-10-03 | Disposition: A | Discharge status: Home / Self Care | Payer: Medicare Other | Source: Home / Self Care | Attending: Emergency Medicine | Admitting: Emergency Medicine

## 2013-10-03 DIAGNOSIS — R0989 Other specified symptoms and signs involving the circulatory and respiratory systems: Secondary | ICD-10-CM

## 2013-10-03 DIAGNOSIS — J440 Chronic obstructive pulmonary disease with acute lower respiratory infection: Secondary | ICD-10-CM

## 2013-10-03 DIAGNOSIS — I517 Cardiomegaly: Secondary | ICD-10-CM

## 2013-10-03 MED ORDER — PREDNISONE 10 MG PO TABS: ORAL_TABLET | ORAL | Status: DC

## 2013-10-03 MED ORDER — AZITHROMYCIN 250 MG PO TABS: ORAL_TABLET | ORAL | Status: DC

## 2013-10-03 NOTE — ED Provider Notes (Signed)
CSN: [id number]     Arrival date & time 10/03/13  1516 History   None    Chief Complaint  Patient presents with  . Nasal Congestion     (Consider location/radiation/quality/duration/timing/severity/associated sxs/prior Treatment) HPI Curtis Fuller is a 80 y.o. male who complains of onset of cold symptoms for 2 days.  The symptoms are constant and mild-moderate in severity.  He has a history of COPD.  He lives in assisted living.  Taking Combivent and Advair.   No sore throat + cough No pleuritic pain No wheezing + nasal congestion + post-nasal drainage No sinus pain/pressure + chest congestion No itchy/red eyes No earache No hemoptysis No SOB No chills/sweats No fever No nausea No vomiting No abdominal pain No diarrhea No skin rashes No fatigue No myalgias No headache  + mild confusion yesterday (which he gets sometimes when he gets sick) but much more lucid today according to his daughter who is here with him.      Past Medical History  Diagnosis Date  . COPD (chronic obstructive pulmonary disease)   . Hypertension   . Environmental allergies   . BPH (benign prostatic hyperplasia)   . Memory loss   . Urinary incontinence    Past Surgical History  Procedure Laterality Date  . Prostatectomy    . Hernia repair     History reviewed. No pertinent family history. History  Substance Use Topics  . Smoking status: Never Smoker   . Smokeless tobacco: Not on file  . Alcohol Use: No    Review of Systems  All other systems reviewed and are negative.      Allergies  Tetanus toxoids  Home Medications   Current Outpatient Rx  Name  Route  Sig  Dispense  Refill  . acetaminophen (TYLENOL) 500 MG tablet   Oral   Take 500 mg by mouth every 6 (six) hours as needed. For joint pain         . albuterol-ipratropium (COMBIVENT) 18-103 MCG/ACT inhaler   Inhalation   Inhale 2 puffs into the lungs every 6 (six) hours as needed.         Marland Kitchen amitriptyline (ELAVIL) 10 MG  tablet   Oral   Take 10 mg by mouth at bedtime.         Marland Kitchen amoxicillin (AMOXIL) 500 MG capsule   Oral   Take 2,000 mg by mouth daily as needed. 1 hour prior to dental appointment         . azithromycin (ZITHROMAX Z-PAK) 250 MG tablet      Use as directed   1 each   0   . Cephalexin 500 MG tablet   Oral   Take 1 tablet (500 mg total) by mouth 4 (four) times daily.   27 tablet   0   . fluconazole (DIFLUCAN) 100 MG tablet   Oral   Take 100 mg by mouth daily.         . Fluticasone-Salmeterol (ADVAIR) 250-50 MCG/DOSE AEPB   Inhalation   Inhale 1 puff into the lungs every 12 (twelve) hours.         Marland Kitchen guaifenesin (ROBITUSSIN) 100 MG/5ML syrup   Oral   Take 200 mg by mouth daily as needed for cough.         . meloxicam (MOBIC) 7.5 MG tablet   Oral   Take 7.5 mg by mouth daily.         . montelukast (SINGULAIR) 10 MG tablet   Oral  Take 10 mg by mouth at bedtime.         . predniSONE (DELTASONE) 10 MG tablet      20mg  BID for 2 days, 10mg  BID for 2 days, 10mg  daily for 2 days, then stop   14 tablet   0   . sodium chloride (OCEAN) 0.65 % SOLN nasal spray   Each Nare   Place 1 spray into both nostrils daily as needed for congestion.         . triamterene-hydrochlorothiazide (MAXZIDE-25) 37.5-25 MG per tablet   Oral   Take 0.5 tablets by mouth daily.          BP 147/67  Pulse 94  Temp(Src) 97.6 F (36.4 C) (Oral)  Resp 20  Ht 5' (1.524 m)  Wt 154 lb (69.854 kg)  BMI 30.08 kg/m2  SpO2 94% Physical Exam  Nursing note and vitals reviewed. Constitutional: He is oriented to person, place, and time. He appears well-developed and well-nourished. He is cooperative.  Non-toxic appearance. He does not appear ill.  HENT:  Head: Normocephalic and atraumatic.  Right Ear: Tympanic membrane, external ear and ear canal normal.  Left Ear: Tympanic membrane, external ear and ear canal normal.  Nose: Mucosal edema and rhinorrhea present.  Mouth/Throat: Uvula  is midline. No oropharyngeal exudate, posterior oropharyngeal edema or posterior oropharyngeal erythema.  Eyes: No scleral icterus.  Neck: Neck supple.  Cardiovascular: Normal rate, regular rhythm and normal heart sounds.   Pulmonary/Chest: Effort normal. No accessory muscle usage. No respiratory distress. He has no decreased breath sounds. He has wheezes (scattered bilateral upper wheezes / rhonchi).  Neurological: He is alert and oriented to person, place, and time. He is not disoriented. GCS eye subscore is 4. GCS verbal subscore is 5. GCS motor subscore is 6.  Skin: Skin is warm and dry.  Psychiatric: He has a normal mood and affect. His speech is normal and behavior is normal.    ED Course  Procedures (including critical care time) Labs Review Labs Reviewed - No data to display Imaging Review Dg Chest 2 View  10/03/2013   CLINICAL DATA:  Chest congestion and wheezing.  EXAM: CHEST  2 VIEW  COMPARISON:  Radiographs dated 05/18/2013 and 03/08/2012  FINDINGS: There is chronic cardiomegaly with tortuosity of the thoracic aorta. Pulmonary vascularity is normal. The lungs are clear. No effusions. Chronic accentuation of the thoracic kyphosis.  IMPRESSION: No acute abnormality.  Chronic cardiomegaly.   Electronically Signed   By: Geanie Cooley M.D.   On: 10/03/2013 16:03      MDM   Final diagnoses:  Chest congestion    Chest Xray negative for pneumonia.  See results above.  Will treat with Z-pak for prophylaxis and then 6 days of milder dose of prednisone for mild COPD exacerbation and viral bronchitis.  No signs of acute CHF on exam, but early CHF cannot be ruled out.  Can use Robitussin OTC for cough.  Advised to check vitals daily at nursing home and to be checked by nurse daily.  If any worsening (fever, confusion, worsening cough, needs to be rechecked sooner).  Advised daughter that with his age, male, and nursing home resident, any pneumonia is an automatic ER visit and likely  admission.  But as of today, CXR neg and patient appears healthy except other than lung findings on exam, so I'm comfortable treating outpatient with close follow up with family as well as the nursing home's medical staff.  Please follow up with your  PCP within the next week to ensure improving.    Marlaine HindJeffrey H Dayonna Selbe, MD 10/03/13 [phone]

## 2013-10-03 NOTE — ED Notes (Signed)
C/o cough, congestion, rhinitis since yesterday

## 2013-10-06 ENCOUNTER — Telehealth: Payer: Self-pay | Admitting: *Deleted

## 2013-12-10 ENCOUNTER — Encounter (HOSPITAL_COMMUNITY): Payer: Self-pay | Admitting: Emergency Medicine

## 2013-12-10 ENCOUNTER — Emergency Department (HOSPITAL_COMMUNITY)
Admission: EM | Admit: 2013-12-10 | Discharge: 2013-12-11 | Disposition: A | Discharge status: Home / Self Care | Payer: Medicare Other | Attending: Emergency Medicine | Admitting: Emergency Medicine

## 2013-12-10 DIAGNOSIS — J449 Chronic obstructive pulmonary disease, unspecified: Secondary | ICD-10-CM | POA: Insufficient documentation

## 2013-12-10 DIAGNOSIS — J4489 Other specified chronic obstructive pulmonary disease: Secondary | ICD-10-CM | POA: Insufficient documentation

## 2013-12-10 DIAGNOSIS — Z791 Long term (current) use of non-steroidal anti-inflammatories (NSAID): Secondary | ICD-10-CM | POA: Insufficient documentation

## 2013-12-10 DIAGNOSIS — Z87448 Personal history of other diseases of urinary system: Secondary | ICD-10-CM | POA: Insufficient documentation

## 2013-12-10 DIAGNOSIS — I10 Essential (primary) hypertension: Secondary | ICD-10-CM | POA: Insufficient documentation

## 2013-12-10 DIAGNOSIS — R040 Epistaxis: Secondary | ICD-10-CM

## 2013-12-10 DIAGNOSIS — IMO0002 Reserved for concepts with insufficient information to code with codable children: Secondary | ICD-10-CM | POA: Insufficient documentation

## 2013-12-10 DIAGNOSIS — Z79899 Other long term (current) drug therapy: Secondary | ICD-10-CM | POA: Insufficient documentation

## 2013-12-10 NOTE — ED Notes (Signed)
Pt's family reports pt was started on flonase x 2 weeks ago, started to have intermittent epistaxis x 3 days ago.  Pt reports nose bleed did not stop tonight when it started after dinner.  Clot noted in R nare, no bleeding noted at this time.

## 2013-12-10 NOTE — ED Notes (Signed)
Epistaxis which started tonight after dinner

## 2013-12-11 MED ORDER — MUPIROCIN 2 % EX OINT: 1.0000 "application " | TOPICAL_OINTMENT | Freq: Two times a day (BID) | CUTANEOUS | Status: AC

## 2013-12-11 MED ORDER — SALINE SPRAY 0.65 % NA SOLN: 2.0000 | NASAL | Status: AC | PRN

## 2013-12-11 NOTE — Discharge Instructions (Signed)
Nosebleed  Should your nose begin bleeding again, hold pressure (HARD!) just below the bony part of your nose.  Hold pressure continuously for 15 minutes.  NO PEEKING!.  If bleeding continues, blow your nose to get rid of any clots.  Spray afrin x 2 into affected nostril.  Then apply pressure again, this time for 30 minutes.  Should your nose continue to bleed after this time, return to the ER for further treatment.  Use mupirocin and nasal saline as instructed  EPISTAXIS - WITHOUT PACKING  EPISTAXIS: You have been seen for Epistaxis (bloody nose).  Epistaxis in the medical term for a bloody nose. Epistaxis is a common condition and although frightening, it seldom becomes serious. The skin in the front of the nose is very fragile and in children is most often damaged by direct trauma (such as nose-picking) or when the skin becomes very dry or irritated (such as in dry environments on when you have a cold).  The bleeding often begins at the end of the nose, so pinching the entire nose for 15 minutes is often effective at stopping the bleeding.  When the bleeding area can be found, the doctor may will use a chemical called Silver Nitrate to stop the bleeding.  Other chemicals are sometimes used to stop the bleeding by causing the oozing blood to clot.  You may have had either or both of these treatments done today.  If the bleeding does not stop with chemical treatment, packing may be placed into the nose.  Although packing is sometimes uncomfortable, it is usually very effective at stopping a bloody nose.  Sometimes the bleeding starts from a blood vessel way in the back of the nose or in the throat. This type of bleeding is difficult to control and often requires nasal packing by an Ear, Nose and Throat specialist.  Applying ice to the forehead or back of the neck is not effective and WILL NOT stop the bleeding. Instead, press or squeeze the nose directly.  If the bleeding doesnt stop in 15 minutes,  return here or go to the nearest Emergency Department.  Avoid aspirin and non-steroidal anti-inflammatory medications (Advil, Motrin, Ibuprofen, Aleve, Naprosyn, etc.) and alcohol. These medications will make it difficult for your blood to clot.  Avoid blowing your nose, sneezing and straining for the next several days.  YOU SHOULD SEEK MEDICAL ATTENTION IMMEDIATELY, EITHER HERE OR AT THE NEAREST EMERGENCY DEPARTMENT, IF ANY OF THE FOLLOWING OCCURS:      You are unable to stop the bleeding with direct pressure.     You experience bleeding down the back of the throat.     The packing gets out of place.  If you develop symptoms of Shortness of Breath, Chest Pain, Swelling of lips, mouth or tongue or if your condition becomes worse with any new symptoms, see your doctor or return to the Emergency Department for immediate care. Emergency services are not intended to be a substitute for comprehensive medical attention.  Please contact your doctor for follow up if not improving as expected.   Call your doctor in 5-7 days or as directed if there is no improvement.

## 2013-12-11 NOTE — ED Notes (Signed)
No active bleeding noted from pt's nose at this time.  [facility] EDP notified

## 2013-12-11 NOTE — ED Notes (Signed)
[place] [facility] [place] no answer.

## 2013-12-11 NOTE — ED Notes (Signed)
Assisted EDP with removing clots from his nose.

## 2013-12-11 NOTE — ED Notes (Signed)
[facility] contacted for transport back to [facility] 

## 2013-12-11 NOTE — ED Provider Notes (Signed)
CSN: [id number]     Arrival date & time 12/10/13  2334 History   First MD Initiated Contact with Patient 12/11/13 0014     Chief Complaint  Patient presents with  . Epistaxis     (Consider location/radiation/quality/duration/timing/severity/associated sxs/prior Treatment) HPI 80 year old male presents to emergency room with complaint of nose bleed.  Patient has had intermittent nosebleeds throughout the week.  Patient recently started Flonase about 2 weeks ago for nasal congestion and allergies.  He stopped taking the Flonase on Wednesday.  Tonight after dinner patient had persistent nosebleed coming out of both nares.  He has a large clot in his right nare.  He has not had any bleeding in the last hour.  He denies any pain.  No prior history of nose bleeds, no manipulation of his nose recently.  No blood thinners. Past Medical History  Diagnosis Date  . COPD (chronic obstructive pulmonary disease)   . Hypertension   . Environmental allergies   . BPH (benign prostatic hyperplasia)   . Memory loss   . Urinary incontinence    Past Surgical History  Procedure Laterality Date  . Prostatectomy    . Hernia repair     No family history on file. History  Substance Use Topics  . Smoking status: Never Smoker   . Smokeless tobacco: Not on file  . Alcohol Use: No    Review of Systems   See History of Present Illness; otherwise all other systems are reviewed and negative  Allergies  Tetanus toxoids  Home Medications   Prior to Admission medications   Medication Sig Start Date End Date Taking? Authorizing Provider  acetaminophen (TYLENOL) 500 MG tablet Take 500 mg by mouth every 6 (six) hours as needed. For joint pain   Yes Historical Provider, MD  albuterol-ipratropium (COMBIVENT) 18-103 MCG/ACT inhaler Inhale 1 puff into the lungs 4 (four) times daily.    Yes Historical Provider, MD  amitriptyline (ELAVIL) 10 MG tablet Take 10 mg by mouth at bedtime.   Yes Historical Provider, MD   amoxicillin (AMOXIL) 500 MG capsule Take 2,000 mg by mouth daily as needed. 1 hour prior to dental appointment   Yes Historical Provider, MD  Brinzolamide-Brimonidine [facility](SIMBRINZA) 1-0.2 % SUSP Place 1 drop into both eyes 2 (two) times daily.   Yes Historical Provider, MD  fluconazole (DIFLUCAN) 100 MG tablet Take 100 mg by mouth 1 day or 1 dose. Takes 1 tablet monthly on the 15th of every month   Yes Historical Provider, MD  Fluticasone-Salmeterol (ADVAIR) 250-50 MCG/DOSE AEPB Inhale 1 puff into the lungs daily.    Yes Historical Provider, MD  meloxicam (MOBIC) 7.5 MG tablet Take 7.5 mg by mouth daily. Take with food   Yes Historical Provider, MD  montelukast (SINGULAIR) 10 MG tablet Take 10 mg by mouth daily.    Yes Historical Provider, MD  Multiple Vitamins-Iron (MULTIVITAMINS WITH IRON) TABS tablet Take 1 tablet by mouth daily.   Yes Historical Provider, MD  Multiple Vitamins-Minerals (PRESERVISION AREDS PO) Take 2 capsules by mouth daily.   Yes Historical Provider, MD  triamterene-hydrochlorothiazide (MAXZIDE-25) 37.5-25 MG per tablet Take 0.5 tablets by mouth daily.   Yes Historical Provider, MD  mupirocin ointment (BACTROBAN) 2 % Place 1 application into the nose 2 (two) times daily. 12/11/13   Olivia Mackielga M Treyvin Glidden, MD  sodium chloride (OCEAN) 0.65 % SOLN nasal spray Place 2 sprays into both nostrils as needed for congestion (nasal dryness). 12/11/13   Olivia Mackielga M Milam Allbaugh, MD  BP 130/60  Pulse 64  Temp(Src) 97.7 F (36.5 C) (Oral)  Resp 20  SpO2 97% Physical Exam  Nursing note and vitals reviewed. Constitutional: He appears well-developed and well-nourished. No distress.  HENT:  Head: Normocephalic.  Right Ear: External ear normal.  Left Ear: External ear normal.  Patient has a large clot obstructing his right nare.  He has dried blood at the entrance of his left nare.  No active bleeding at this time.  Patient does not have active bleeding down the back of his throat, but can visualize a large clot  against his right posterior pharynx  Neck: Normal range of motion. Neck supple. No JVD present. No tracheal deviation present. No thyromegaly present.  Cardiovascular: Normal rate, regular rhythm, normal heart sounds and intact distal pulses.  Exam reveals no gallop and no friction rub.   No murmur heard. Pulmonary/Chest: Effort normal and breath sounds normal. No stridor. No respiratory distress. He has no wheezes. He has no rales. He exhibits no tenderness.  Abdominal: Soft. Bowel sounds are normal. He exhibits no distension and no mass. There is no tenderness. There is no rebound and no guarding.  Lymphadenopathy:    He has no cervical adenopathy.  Skin: Skin is warm and dry. No rash noted. No erythema. No pallor.  Psychiatric: He has a normal mood and affect. His behavior is normal. Judgment and thought content normal.    ED Course  Procedures (including critical care time) Labs Review Labs Reviewed - No data to display  Imaging Review No results found.   EKG Interpretation None      MDM   Final diagnoses:  Epistaxis   80 year old male with epistaxis.  Patient's right nare was irrigated with saline.  Large clot was removed with alligator forceps without pain to the patient.  Patient then spit up a large clot from the back of his throat.  Patient has been observed for 2 hours without further bleeding.  Nose inspected using speculum, and no source of bleeding can be seen at this time.  Plan to discharge back to his nursing facility.  Patient has been given Afrin if he has recurrent bleeding that does not resolve with pressure alone.  Patient also instructed to use mupirocin twice a day and nasal saline.    Olivia Mackielga M Clemens Lachman, MD 12/11/13 [phone]

## 2013-12-15 ENCOUNTER — Emergency Department (HOSPITAL_COMMUNITY)
Admission: EM | Admit: 2013-12-15 | Discharge: 2013-12-15 | Disposition: A | Discharge status: Home / Self Care | Payer: Medicare Other | Attending: Emergency Medicine | Admitting: Emergency Medicine

## 2013-12-15 ENCOUNTER — Encounter (HOSPITAL_COMMUNITY): Payer: Self-pay | Admitting: Emergency Medicine

## 2013-12-15 DIAGNOSIS — R040 Epistaxis: Secondary | ICD-10-CM | POA: Insufficient documentation

## 2013-12-15 DIAGNOSIS — Z87448 Personal history of other diseases of urinary system: Secondary | ICD-10-CM | POA: Insufficient documentation

## 2013-12-15 DIAGNOSIS — J449 Chronic obstructive pulmonary disease, unspecified: Secondary | ICD-10-CM | POA: Insufficient documentation

## 2013-12-15 DIAGNOSIS — I10 Essential (primary) hypertension: Secondary | ICD-10-CM | POA: Insufficient documentation

## 2013-12-15 DIAGNOSIS — Z79899 Other long term (current) drug therapy: Secondary | ICD-10-CM | POA: Insufficient documentation

## 2013-12-15 DIAGNOSIS — J4489 Other specified chronic obstructive pulmonary disease: Secondary | ICD-10-CM | POA: Insufficient documentation

## 2013-12-15 DIAGNOSIS — Z791 Long term (current) use of non-steroidal anti-inflammatories (NSAID): Secondary | ICD-10-CM | POA: Insufficient documentation

## 2013-12-15 DIAGNOSIS — IMO0002 Reserved for concepts with insufficient information to code with codable children: Secondary | ICD-10-CM | POA: Insufficient documentation

## 2013-12-15 LAB — BASIC METABOLIC PANEL
BUN: 41 mg/dL — ABNORMAL HIGH (ref 6–23)
CO2: 23 meq/L (ref 19–32)
Calcium: 8.8 mg/dL (ref 8.4–10.5)
Chloride: 103 mEq/L (ref 96–112)
Creatinine, Ser: 1.2 mg/dL (ref 0.50–1.35)
GFR calc Af Amer: 57 mL/min — ABNORMAL LOW (ref >90)
GFR calc non Af Amer: 49 mL/min — ABNORMAL LOW (ref >90)
GLUCOSE: 104 mg/dL — AB (ref 70–99)
POTASSIUM: 3.9 meq/L (ref 3.7–5.3)
SODIUM: 137 meq/L (ref 137–147)

## 2013-12-15 LAB — CBC
HCT: 36.2 % — ABNORMAL LOW (ref 39.0–52.0)
HEMOGLOBIN: 12.3 g/dL — AB (ref 13.0–17.0)
MCH: 30.6 pg (ref 26.0–34.0)
MCHC: 34 g/dL (ref 30.0–36.0)
MCV: 90 fL (ref 78.0–100.0)
Platelets: 180 10*3/uL (ref 150–400)
RBC: 4.02 MIL/uL — AB (ref 4.22–5.81)
RDW: 15.3 % (ref 11.5–15.5)
WBC: 6.4 10*3/uL (ref 4.0–10.5)

## 2013-12-15 LAB — PROTIME-INR
INR: 1.06 (ref 0.00–1.49)
Prothrombin Time: 13.6 seconds (ref 11.6–15.2)

## 2013-12-15 NOTE — ED Notes (Signed)
ENT cart at bedside

## 2013-12-15 NOTE — Discharge Instructions (Signed)

## 2013-12-15 NOTE — ED Provider Notes (Signed)
CSN: [id number]     Arrival date & time 12/15/13  1910 History   First MD Initiated Contact with Patient 12/15/13 1911     Chief Complaint  Patient presents with  . Epistaxis     (Consider location/radiation/quality/duration/timing/severity/associated sxs/prior Treatment) HPI Comments: Patient presents to the ER for evaluation of nosebleed. Patient has had recurrent nosebleeds from the right side intermittently for the last 9 days. Patient was seen in the ER was a long several days ago, but at that time the bleeding had stopped. Today he has had fairly brisk ongoing bleeding for approximately 3 hours despite direct pressure. No known trauma.  Patient is a 80 y.o. male presenting with nosebleeds. The history is provided by the patient and a relative.  Epistaxis   Past Medical History  Diagnosis Date  . COPD (chronic obstructive pulmonary disease)   . Hypertension   . Environmental allergies   . BPH (benign prostatic hyperplasia)   . Memory loss   . Urinary incontinence    Past Surgical History  Procedure Laterality Date  . Prostatectomy    . Hernia repair     No family history on file. History  Substance Use Topics  . Smoking status: Never Smoker   . Smokeless tobacco: Not on file  . Alcohol Use: No    Review of Systems  HENT: Positive for nosebleeds.   All other systems reviewed and are negative.     Allergies  Tetanus toxoids  Home Medications   Prior to Admission medications   Medication Sig Start Date End Date Taking? Authorizing Provider  acetaminophen (TYLENOL) 500 MG tablet Take 500 mg by mouth every 6 (six) hours as needed. For joint pain    Historical Provider, MD  albuterol-ipratropium (COMBIVENT) 18-103 MCG/ACT inhaler Inhale 1 puff into the lungs 4 (four) times daily.     Historical Provider, MD  amitriptyline (ELAVIL) 10 MG tablet Take 10 mg by mouth at bedtime.    Historical Provider, MD  amoxicillin (AMOXIL) 500 MG capsule Take 2,000 mg by mouth  daily as needed. 1 hour prior to dental appointment    Historical Provider, MD  Brinzolamide-Brimonidine [facility](SIMBRINZA) 1-0.2 % SUSP Place 1 drop into both eyes 2 (two) times daily.    Historical Provider, MD  fluconazole (DIFLUCAN) 100 MG tablet Take 100 mg by mouth 1 day or 1 dose. Takes 1 tablet monthly on the 15th of every month    Historical Provider, MD  Fluticasone-Salmeterol (ADVAIR) 250-50 MCG/DOSE AEPB Inhale 1 puff into the lungs daily.     Historical Provider, MD  meloxicam (MOBIC) 7.5 MG tablet Take 7.5 mg by mouth daily. Take with food    Historical Provider, MD  montelukast (SINGULAIR) 10 MG tablet Take 10 mg by mouth daily.     Historical Provider, MD  Multiple Vitamins-Iron (MULTIVITAMINS WITH IRON) TABS tablet Take 1 tablet by mouth daily.    Historical Provider, MD  Multiple Vitamins-Minerals (PRESERVISION AREDS PO) Take 2 capsules by mouth daily.    Historical Provider, MD  mupirocin ointment (BACTROBAN) 2 % Place 1 application into the nose 2 (two) times daily. 12/11/13   Olivia Mackielga M Otter, MD  sodium chloride (OCEAN) 0.65 % SOLN nasal spray Place 2 sprays into both nostrils as needed for congestion (nasal dryness). 12/11/13   Olivia Mackielga M Otter, MD  triamterene-hydrochlorothiazide (MAXZIDE-25) 37.5-25 MG per tablet Take 0.5 tablets by mouth daily.    Historical Provider, MD   BP 141/71  Pulse 83  Temp(Src) 97.7 F (  36.5 C) (Oral)  Resp 17  SpO2 96% Physical Exam  Constitutional: He is oriented to person, place, and time. He appears well-developed and well-nourished. No distress.  HENT:  Head: Normocephalic and atraumatic.  Right Ear: Hearing normal.  Left Ear: Hearing normal.  Nose: Epistaxis is observed.  Mouth/Throat: Oropharynx is clear and moist and mucous membranes are normal.  Eyes: Conjunctivae and EOM are normal. Pupils are equal, round, and reactive to light.  Neck: Normal range of motion. Neck supple.  Cardiovascular: Regular rhythm, S1 normal and S2 normal.  Exam reveals no  gallop and no friction rub.   No murmur heard. Pulmonary/Chest: Effort normal and breath sounds normal. No respiratory distress. He exhibits no tenderness.  Abdominal: Soft. Normal appearance and bowel sounds are normal. There is no hepatosplenomegaly. There is no tenderness. There is no rebound, no guarding, no tenderness at McBurney's point and negative Murphy's sign. No hernia.  Musculoskeletal: Normal range of motion.  Neurological: He is alert and oriented to person, place, and time. He has normal strength. No cranial nerve deficit or sensory deficit. Coordination normal. GCS eye subscore is 4. GCS verbal subscore is 5. GCS motor subscore is 6.  Skin: Skin is warm, dry and intact. No rash noted. No cyanosis.  Psychiatric: He has a normal mood and affect. His speech is normal and behavior is normal. Thought content normal.    ED Course  Procedures (including critical care time) Labs Review Labs Reviewed  CBC  BASIC METABOLIC PANEL  PROTIME-INR    Imaging Review No results found.   EKG Interpretation None      MDM   Final diagnoses:  Epistaxis, recurrent    Patient presents to the ER for recurrent epistaxis. Patient has active bleeding currently. He has had fairly brisk bleeding today and has had intermittent bleeding for 9 days. Because of the amount of bleeding currently, I felt it was best to pack the nose. From the records and talking with family, he has had significant amount of bleeding in the back of his throat, evaluation the other day did not show any obvious areas of bleeding, and I am therefore concerned about the possibility of posterior bleed. Patient was therefore packed with a 9 cm anterior/posterior rapid rhino nasal balloon. He tolerated this. We'll check basic labs to ensure that he is not anemic or thrombocytopenic. Return to nursing home if labs are normal. Patient has followup with ENT tomorrow. I have recommended to family that an attempt be made to move the  appointment for 2 or 3 days from now, as to remove packing tomorrow would be too soon.   Gilda Creasehristopher J. Arneta Mahmood, MD 12/15/13 2019

## 2013-12-15 NOTE — ED Notes (Signed)
Per EMS pt from [facility] c/o intermittent right nostril nose bleeds x 9 days. sts started taking intranasal flonase 9 days ago when nose bleeds started- stopped taking flonase 7 days ago with continuing intermittent epistaxis. Pt not on any blood thinners. Has hx of dementia.

## 2014-06-24 ENCOUNTER — Inpatient Hospital Stay (HOSPITAL_COMMUNITY)
Admission: EM | Admit: 2014-06-24 | Discharge: 2014-06-30 | DRG: 087 | Disposition: A | Discharge status: Skilled Nursing Facility | Payer: Medicare Other | Attending: Neurosurgery | Admitting: Neurosurgery

## 2014-06-24 ENCOUNTER — Emergency Department (HOSPITAL_COMMUNITY): Payer: Medicare Other

## 2014-06-24 ENCOUNTER — Encounter (HOSPITAL_COMMUNITY): Payer: Self-pay

## 2014-06-24 DIAGNOSIS — S065XAA Traumatic subdural hemorrhage with loss of consciousness status unknown, initial encounter: Secondary | ICD-10-CM | POA: Diagnosis present

## 2014-06-24 DIAGNOSIS — H9190 Unspecified hearing loss, unspecified ear: Secondary | ICD-10-CM | POA: Diagnosis present

## 2014-06-24 DIAGNOSIS — S61411A Laceration without foreign body of right hand, initial encounter: Secondary | ICD-10-CM | POA: Diagnosis present

## 2014-06-24 DIAGNOSIS — S065X0A Traumatic subdural hemorrhage without loss of consciousness, initial encounter: Secondary | ICD-10-CM | POA: Diagnosis present

## 2014-06-24 DIAGNOSIS — Z79899 Other long term (current) drug therapy: Secondary | ICD-10-CM

## 2014-06-24 DIAGNOSIS — Y92129 Unspecified place in nursing home as the place of occurrence of the external cause: Secondary | ICD-10-CM

## 2014-06-24 DIAGNOSIS — S0003XA Contusion of scalp, initial encounter: Secondary | ICD-10-CM | POA: Diagnosis present

## 2014-06-24 DIAGNOSIS — I10 Essential (primary) hypertension: Secondary | ICD-10-CM | POA: Diagnosis present

## 2014-06-24 DIAGNOSIS — J449 Chronic obstructive pulmonary disease, unspecified: Secondary | ICD-10-CM | POA: Diagnosis present

## 2014-06-24 DIAGNOSIS — G319 Degenerative disease of nervous system, unspecified: Secondary | ICD-10-CM | POA: Diagnosis present

## 2014-06-24 DIAGNOSIS — R339 Retention of urine, unspecified: Secondary | ICD-10-CM | POA: Diagnosis present

## 2014-06-24 DIAGNOSIS — W19XXXA Unspecified fall, initial encounter: Secondary | ICD-10-CM | POA: Diagnosis present

## 2014-06-24 DIAGNOSIS — S065X9A Traumatic subdural hemorrhage with loss of consciousness of unspecified duration, initial encounter: Secondary | ICD-10-CM | POA: Diagnosis present

## 2014-06-24 DIAGNOSIS — N400 Enlarged prostate without lower urinary tract symptoms: Secondary | ICD-10-CM | POA: Diagnosis present

## 2014-06-24 LAB — CBC WITH DIFFERENTIAL/PLATELET
BASOS ABS: 0 10*3/uL (ref 0.0–0.1)
BASOS PCT: 1 % (ref 0–1)
Basophils Absolute: 0 10*3/uL (ref 0.0–0.1)
Basophils Relative: 0 % (ref 0–1)
EOS ABS: 0.1 10*3/uL (ref 0.0–0.7)
EOS PCT: 1 % (ref 0–5)
Eosinophils Absolute: 0.1 10*3/uL (ref 0.0–0.7)
Eosinophils Relative: 2 % (ref 0–5)
HCT: 34.4 % — ABNORMAL LOW (ref 39.0–52.0)
HEMATOCRIT: 37 % — AB (ref 39.0–52.0)
Hemoglobin: 12.4 g/dL — ABNORMAL LOW (ref 13.0–17.0)
Hemoglobin: 11.4 g/dL — ABNORMAL LOW (ref 13.0–17.0)
LYMPHS ABS: 0.8 10*3/uL (ref 0.7–4.0)
Lymphocytes Relative: 11 % — ABNORMAL LOW (ref 12–46)
Lymphocytes Relative: 8 % — ABNORMAL LOW (ref 12–46)
Lymphs Abs: 0.6 10*3/uL — ABNORMAL LOW (ref 0.7–4.0)
MCH: 30.3 pg (ref 26.0–34.0)
MCH: 29.1 pg (ref 26.0–34.0)
MCHC: 33.5 g/dL (ref 30.0–36.0)
MCHC: 33.1 g/dL (ref 30.0–36.0)
MCV: 90.5 fL (ref 78.0–100.0)
MCV: 87.8 fL (ref 78.0–100.0)
MONO ABS: 0.5 10*3/uL (ref 0.1–1.0)
Monocytes Absolute: 0.6 10*3/uL (ref 0.1–1.0)
Monocytes Relative: 9 % (ref 3–12)
Monocytes Relative: 5 % (ref 3–12)
Neutro Abs: 4.5 10*3/uL (ref 1.7–7.7)
Neutro Abs: 8.8 10*3/uL — ABNORMAL HIGH (ref 1.7–7.7)
Neutrophils Relative %: 77 % (ref 43–77)
Neutrophils Relative %: 86 % — ABNORMAL HIGH (ref 43–77)
PLATELETS: 192 10*3/uL (ref 150–400)
Platelets: 206 10*3/uL (ref 150–400)
RBC: 4.09 MIL/uL — ABNORMAL LOW (ref 4.22–5.81)
RBC: 3.92 MIL/uL — AB (ref 4.22–5.81)
RDW: 16 % — AB (ref 11.5–15.5)
RDW: 16 % — AB (ref 11.5–15.5)
WBC: 5.8 10*3/uL (ref 4.0–10.5)
WBC: 10.3 10*3/uL (ref 4.0–10.5)

## 2014-06-24 LAB — APTT
APTT: 29 s (ref 24–37)
aPTT: 34 seconds (ref 24–37)

## 2014-06-24 LAB — BASIC METABOLIC PANEL
Anion gap: 14 (ref 5–15)
BUN: 29 mg/dL — ABNORMAL HIGH (ref 6–23)
CALCIUM: 8.8 mg/dL (ref 8.4–10.5)
CO2: 22 meq/L (ref 19–32)
CREATININE: 1.21 mg/dL (ref 0.50–1.35)
Chloride: 102 mEq/L (ref 96–112)
GFR calc Af Amer: 56 mL/min — ABNORMAL LOW (ref >90)
GFR calc non Af Amer: 48 mL/min — ABNORMAL LOW (ref >90)
GLUCOSE: 105 mg/dL — AB (ref 70–99)
Potassium: 4 mEq/L (ref 3.7–5.3)
Sodium: 138 mEq/L (ref 137–147)

## 2014-06-24 LAB — PROTIME-INR
INR: 1.08 (ref 0.00–1.49)
INR: 1.09 (ref 0.00–1.49)
Prothrombin Time: 14.1 seconds (ref 11.6–15.2)
Prothrombin Time: 14.2 seconds (ref 11.6–15.2)

## 2014-06-24 MED ORDER — LIDOCAINE-EPINEPHRINE 2 %-1:100000 IJ SOLN
20.0000 mL | Freq: Once | INTRAMUSCULAR | Status: AC
  Administered 2014-06-24: 20 mL
  Filled 2014-06-24 (×2): qty 20

## 2014-06-24 MED ORDER — BISACODYL 10 MG RE SUPP: 10.0000 mg | Freq: Every day | RECTAL | Status: DC | PRN

## 2014-06-24 MED ORDER — MAGNESIUM CITRATE PO SOLN: 1.0000 | Freq: Once | ORAL | Status: AC | PRN

## 2014-06-24 MED ORDER — ACETAMINOPHEN 500 MG PO TABS: 500.0000 mg | ORAL_TABLET | Freq: Four times a day (QID) | ORAL | Status: DC | PRN

## 2014-06-24 MED ORDER — NON FORMULARY: 1.0000 [drp] | Freq: Two times a day (BID) | Status: DC

## 2014-06-24 MED ORDER — IPRATROPIUM-ALBUTEROL 18-103 MCG/ACT IN AERO: 1.0000 | INHALATION_SPRAY | Freq: Four times a day (QID) | RESPIRATORY_TRACT | Status: DC

## 2014-06-24 MED ORDER — ONDANSETRON HCL 4 MG/2ML IJ SOLN: 4.0000 mg | Freq: Four times a day (QID) | INTRAMUSCULAR | Status: DC | PRN

## 2014-06-24 MED ORDER — BRINZOLAMIDE 1 % OP SUSP
1.0000 [drp] | Freq: Two times a day (BID) | OPHTHALMIC | Status: DC
  Administered 2014-06-24 – 2014-06-30 (×7): 1 [drp] via OPHTHALMIC
  Filled 2014-06-24: qty 10

## 2014-06-24 MED ORDER — BRIMONIDINE TARTRATE 0.2 % OP SOLN
1.0000 [drp] | Freq: Two times a day (BID) | OPHTHALMIC | Status: DC
  Administered 2014-06-24 – 2014-06-30 (×6): 1 [drp] via OPHTHALMIC
  Filled 2014-06-24: qty 5

## 2014-06-24 MED ORDER — SODIUM CHLORIDE 0.9 % IJ SOLN
3.0000 mL | Freq: Two times a day (BID) | INTRAMUSCULAR | Status: DC
  Administered 2014-06-24 – 2014-06-27 (×6): 3 mL via INTRAVENOUS

## 2014-06-24 MED ORDER — MONTELUKAST SODIUM 10 MG PO TABS
10.0000 mg | ORAL_TABLET | Freq: Every day | ORAL | Status: DC
  Administered 2014-06-25: 10 mg via ORAL
  Filled 2014-06-24 (×4): qty 1

## 2014-06-24 MED ORDER — SENNOSIDES-DOCUSATE SODIUM 8.6-50 MG PO TABS
1.0000 | ORAL_TABLET | Freq: Every evening | ORAL | Status: DC | PRN
  Filled 2014-06-24: qty 1

## 2014-06-24 MED ORDER — TRIAMTERENE-HCTZ 37.5-25 MG PO TABS
0.5000 | ORAL_TABLET | Freq: Every day | ORAL | Status: DC
  Administered 2014-06-25: 0.5 via ORAL
  Filled 2014-06-24 (×3): qty 1
  Filled 2014-06-24: qty 0.5

## 2014-06-24 MED ORDER — SENNA 8.6 MG PO TABS
1.0000 | ORAL_TABLET | Freq: Two times a day (BID) | ORAL | Status: DC
  Administered 2014-06-24 – 2014-06-25 (×3): 8.6 mg via ORAL
  Filled 2014-06-24 (×7): qty 1

## 2014-06-24 MED ORDER — IPRATROPIUM-ALBUTEROL 0.5-2.5 (3) MG/3ML IN SOLN
3.0000 mL | Freq: Four times a day (QID) | RESPIRATORY_TRACT | Status: DC
  Administered 2014-06-25 – 2014-06-26 (×3): 3 mL via RESPIRATORY_TRACT
  Filled 2014-06-24 (×4): qty 3

## 2014-06-24 MED ORDER — TAB-A-VITE/IRON PO TABS
1.0000 | ORAL_TABLET | Freq: Every day | ORAL | Status: DC
  Administered 2014-06-25: 1 via ORAL
  Filled 2014-06-24 (×6): qty 1

## 2014-06-24 MED ORDER — SODIUM CHLORIDE 0.9 % IV SOLN: 250.0000 mL | INTRAVENOUS | Status: DC | PRN

## 2014-06-24 MED ORDER — SODIUM CHLORIDE 0.9 % IJ SOLN: 3.0000 mL | INTRAMUSCULAR | Status: DC | PRN

## 2014-06-24 MED ORDER — ACETAMINOPHEN 325 MG PO TABS
650.0000 mg | ORAL_TABLET | Freq: Four times a day (QID) | ORAL | Status: DC | PRN
  Administered 2014-06-26: 325 mg via ORAL
  Filled 2014-06-24 (×2): qty 2

## 2014-06-24 MED ORDER — ACETAMINOPHEN 325 MG PO TABS
650.0000 mg | ORAL_TABLET | Freq: Once | ORAL | Status: DC
  Filled 2014-06-24: qty 2

## 2014-06-24 MED ORDER — POTASSIUM CHLORIDE IN NACL 20-0.9 MEQ/L-% IV SOLN
INTRAVENOUS | Status: DC
  Administered 2014-06-24: 22:00:00 via INTRAVENOUS
  Filled 2014-06-24 (×3): qty 1000

## 2014-06-24 MED ORDER — ACETAMINOPHEN 650 MG RE SUPP
650.0000 mg | Freq: Four times a day (QID) | RECTAL | Status: DC | PRN
Start: 2014-06-24 — End: 2014-06-30
  Administered 2014-06-26 – 2014-06-30 (×3): 650 mg via RECTAL
  Filled 2014-06-24 (×3): qty 1

## 2014-06-24 MED ORDER — AMITRIPTYLINE HCL 10 MG PO TABS
10.0000 mg | ORAL_TABLET | Freq: Every day | ORAL | Status: DC
  Administered 2014-06-24: 10 mg via ORAL
  Filled 2014-06-24 (×7): qty 1

## 2014-06-24 MED ORDER — SALINE SPRAY 0.65 % NA SOLN
2.0000 | NASAL | Status: DC | PRN
  Filled 2014-06-24: qty 44

## 2014-06-24 MED ORDER — OXYCODONE HCL 5 MG PO TABS
5.0000 mg | ORAL_TABLET | ORAL | Status: DC | PRN
  Filled 2014-06-24: qty 1

## 2014-06-24 MED ORDER — FINASTERIDE 5 MG PO TABS
5.0000 mg | ORAL_TABLET | Freq: Every day | ORAL | Status: DC
  Administered 2014-06-25: 5 mg via ORAL
  Filled 2014-06-24 (×4): qty 1

## 2014-06-24 MED ORDER — ONDANSETRON HCL 4 MG PO TABS: 4.0000 mg | ORAL_TABLET | Freq: Four times a day (QID) | ORAL | Status: DC | PRN

## 2014-06-24 MED ORDER — OXYCODONE HCL 5 MG PO TABS
5.0000 mg | ORAL_TABLET | Freq: Once | ORAL | Status: AC
  Administered 2014-06-24: 5 mg via ORAL
  Filled 2014-06-24: qty 1

## 2014-06-24 MED ORDER — MORPHINE SULFATE 2 MG/ML IJ SOLN
2.0000 mg | Freq: Once | INTRAMUSCULAR | Status: AC
  Administered 2014-06-24: 2 mg via INTRAVENOUS
  Filled 2014-06-24: qty 1

## 2014-06-24 MED ORDER — HYDROMORPHONE HCL 1 MG/ML IJ SOLN: 0.5000 mg | INTRAMUSCULAR | Status: DC | PRN

## 2014-06-24 MED ORDER — MOMETASONE FURO-FORMOTEROL FUM 100-5 MCG/ACT IN AERO
2.0000 | INHALATION_SPRAY | Freq: Two times a day (BID) | RESPIRATORY_TRACT | Status: DC
  Administered 2014-06-25 – 2014-06-30 (×6): 2 via RESPIRATORY_TRACT
  Filled 2014-06-24: qty 8.8

## 2014-06-24 MED ORDER — LIDOCAINE-EPINEPHRINE-TETRACAINE (LET) SOLUTION
3.0000 mL | Freq: Once | NASAL | Status: AC
  Administered 2014-06-24: 3 mL via TOPICAL
  Filled 2014-06-24: qty 3

## 2014-06-24 NOTE — ED Provider Notes (Signed)
CSN: [id number]     Arrival date & time 06/24/14  1600 History   First MD Initiated Contact with Patient 06/24/14 1601     Chief Complaint  Patient presents with  . Fall     (Consider location/radiation/quality/duration/timing/severity/associated sxs/prior Treatment) HPI  80 year old male presents after a fall at his nursing home. Patient does not remember the fall. Per EMS the patient is alert and oriented at his normal baseline. He was probably found sitting in his chair remarking that he had fallen. He is a new hematoma to his right for head. Staff told EMS the patient fell last week and had his hip evaluated for possible fracture. The patient is otherwise not been ill. The patient does state his right shoulder and right forehead hurt.   Past Medical History  Diagnosis Date  . COPD (chronic obstructive pulmonary disease)   . Hypertension   . Environmental allergies   . BPH (benign prostatic hyperplasia)   . Memory loss   . Urinary incontinence    Past Surgical History  Procedure Laterality Date  . Prostatectomy    . Hernia repair     No family history on file. History  Substance Use Topics  . Smoking status: Never Smoker   . Smokeless tobacco: Not on file  . Alcohol Use: No    Review of Systems  Unable to perform ROS: Dementia      Allergies  Tetanus toxoids  Home Medications   Prior to Admission medications   Medication Sig Start Date End Date Taking? Authorizing Provider  acetaminophen (TYLENOL) 500 MG tablet Take 500 mg by mouth every 6 (six) hours as needed. For joint pain    Historical Provider, MD  albuterol-ipratropium (COMBIVENT) 18-103 MCG/ACT inhaler Inhale 1 puff into the lungs 4 (four) times daily.     Historical Provider, MD  amitriptyline (ELAVIL) 10 MG tablet Take 10 mg by mouth at bedtime.    Historical Provider, MD  amoxicillin (AMOXIL) 500 MG capsule Take 2,000 mg by mouth daily as needed. 1 hour prior to dental appointment    Historical  Provider, MD  Brinzolamide-Brimonidine [facility](SIMBRINZA) 1-0.2 % SUSP Place 1 drop into both eyes 2 (two) times daily.    Historical Provider, MD  fluconazole (DIFLUCAN) 100 MG tablet Take 100 mg by mouth 1 day or 1 dose. Takes 1 tablet monthly on the 15th of every month    Historical Provider, MD  Fluticasone-Salmeterol (ADVAIR) 250-50 MCG/DOSE AEPB Inhale 1 puff into the lungs daily.     Historical Provider, MD  meloxicam (MOBIC) 7.5 MG tablet Take 7.5 mg by mouth daily. Take with food    Historical Provider, MD  montelukast (SINGULAIR) 10 MG tablet Take 10 mg by mouth daily.     Historical Provider, MD  Multiple Vitamins-Iron (MULTIVITAMINS WITH IRON) TABS tablet Take 1 tablet by mouth daily.    Historical Provider, MD  Multiple Vitamins-Minerals (PRESERVISION AREDS PO) Take 2 capsules by mouth daily.    Historical Provider, MD  mupirocin ointment (BACTROBAN) 2 % Place 1 application into the nose 2 (two) times daily. 12/11/13   Olivia Mackielga M Otter, MD  sodium chloride (OCEAN) 0.65 % SOLN nasal spray Place 2 sprays into both nostrils as needed for congestion (nasal dryness). 12/11/13   Olivia Mackielga M Otter, MD  triamterene-hydrochlorothiazide (MAXZIDE-25) 37.5-25 MG per tablet Take 0.5 tablets by mouth daily.    Historical Provider, MD   Pulse 69  Resp 18 Physical Exam  Constitutional: He appears well-developed and well-nourished.  HENT:  Head: Normocephalic.    Right Ear: External ear normal.  Left Ear: External ear normal.  Nose: Nose normal.  Eyes: Right eye exhibits no discharge. Left eye exhibits no discharge.  Neck: Neck supple. No spinous process tenderness and no muscular tenderness present.  Cardiovascular: Normal rate, regular rhythm, normal heart sounds and intact distal pulses.   Pulmonary/Chest: Effort normal and breath sounds normal.  Abdominal: Soft. He exhibits no distension. There is no tenderness.  Musculoskeletal:       Right shoulder: He exhibits tenderness. He exhibits no deformity.        Right hip: He exhibits decreased range of motion and tenderness.       Cervical back: He exhibits no tenderness.       Arms:      Hands: Neurological: He is alert.  Oriented to self, location, day of week, off on year.   Skin: Skin is warm and dry.  Nursing note and vitals reviewed.   ED Course  LACERATION REPAIR Date/Time: 06/24/2014 7:32 PM Performed by: Pricilla LovelessGOLDSTON, Margueritte Guthridge T Authorized by: Pricilla LovelessGOLDSTON, Arnie Clingenpeel T Consent: Verbal consent obtained. Risks and benefits: risks, benefits and alternatives were discussed Body area: upper extremity Location details: right hand Laceration length: 4 cm Foreign bodies: no foreign bodies Tendon involvement: none Nerve involvement: none Vascular damage: no Anesthesia: local infiltration Local anesthetic: lidocaine 2% with epinephrine Anesthetic total: 3 ml Patient sedated: no Preparation: Patient was prepped and draped in the usual sterile fashion. Skin closure: 4-0 Prolene Number of sutures: 4 Technique: simple Approximation: close Approximation difficulty: simple Dressing: 4x4 sterile gauze and antibiotic ointment Patient tolerance: Patient tolerated the procedure well with no immediate complications   (including critical care time) Labs Review Labs Reviewed  BASIC METABOLIC PANEL - Abnormal; Notable for the following:    Glucose, Bld 105 (*)    BUN 29 (*)    GFR calc non Af Amer 48 (*)    GFR calc Af Amer 56 (*)    All other components within normal limits  CBC WITH DIFFERENTIAL - Abnormal; Notable for the following:    RBC 4.09 (*)    Hemoglobin 12.4 (*)    HCT 37.0 (*)    RDW 16.0 (*)    Lymphocytes Relative 11 (*)    Lymphs Abs 0.6 (*)    All other components within normal limits  PROTIME-INR  APTT  CBC WITH DIFFERENTIAL  APTT  PROTIME-INR    Imaging Review Dg Shoulder Right  06/24/2014   CLINICAL DATA:  80 year old male with unwitnessed fall and bruising on lateral right shoulder  EXAM: RIGHT SHOULDER - 2+ VIEW   COMPARISON:  Prior chest x-ray 10/03/2013  FINDINGS: There is no evidence of fracture or dislocation. There is no evidence of arthropathy or other focal bone abnormality. Soft tissues are unremarkable.  IMPRESSION: Negative.   Electronically Signed   By: Malachy MoanHeath  McCullough M.D.   On: 06/24/2014 17:17   Dg Hip Complete Right  06/24/2014   CLINICAL DATA:  Unwitnessed fall. Initial encounter. The patient does not remember the fall. Hematoma above the right eye. Bruising lateral right shoulder and hip.  EXAM: RIGHT HIP - COMPLETE 2+ VIEW  COMPARISON:  None.  FINDINGS: There are significant degenerative changes in the hips bilaterally. No evidence for acute fracture or dislocation. Degenerative changes are seen in the lower lumbar spine.  Bowel gas pattern is nonobstructive.  IMPRESSION: 1. Significant degenerative changes. 2. No evidence for acute  abnormality.   Electronically Signed  By: Rosalie Gums M.D.   On: 06/24/2014 17:19   Ct Head Wo Contrast  06/24/2014   CLINICAL DATA:  Unwitnessed fall, hematoma above right eye  EXAM: CT HEAD WITHOUT CONTRAST  TECHNIQUE: Contiguous axial images were obtained from the base of the skull through the vertex without intravenous contrast.  COMPARISON:  07/18/2013  FINDINGS: Right frontoparietal subdural hematoma, measuring up to 14 mm (series 3/image 18), with layering hematocrit level (series 3/ image 13).  Left frontoparietal subdural hematoma, measuring up to 8 mm (series 3/ image 22).  No evidence of parenchymal or intraventricular hemorrhage.  No mass effect. 4 mm rightward midline shift. Basal cisterns remain patent.  No evidence of acute infarction.  Very mild small vessel ischemic changes. Intracranial atherosclerosis.  Mild global cortical atrophy, age appropriate.  No ventriculomegaly.  Partial opacification of the bilateral ethmoid sinuses. Mild mucosal thickening of the bilateral maxillary sinuses. Chronic right sphenoid and frontal sinus opacification.  Bilateral mastoid air cells are clear.  Extracranial hematoma overlying the right frontal bone, measuring up to 12 mm in thickness (series 3/ image 16).  No evidence of calvarial fracture.  IMPRESSION: Bilateral subdural hematomas, measuring up to 14 mm on the right and 8 mm on the left.  4 mm rightward midline shift. Basal cisterns remain patent. No evidence of intraventricular hemorrhage.  Extracranial hematoma overlying the right frontal bone, measuring 12 mm in thickness. No evidence of calvarial fracture.  Critical Value/emergent results were called by telephone at the time of interpretation on 06/24/2014 at 5:07 pm to Dr. Pricilla Loveless , who verbally acknowledged these results.   Electronically Signed   By: Charline Bills M.D.   On: 06/24/2014 17:07     EKG Interpretation   Date/Time:  Friday June 24 2014 16:11:23 EST Ventricular Rate:  74 PR Interval:  197 QRS Duration: 127 QT Interval:  422 QTC Calculation: 468 R Axis:   -42 Text Interpretation:  Age not entered, assumed to be  80 years old for  purpose of ECG interpretation Sinus or ectopic atrial rhythm Multiform  ventricular premature complexes LVH with IVCD, LAD and secondary repol  abnrm Anterior ST elevation, probably due to LVH Baseline wander in  lead(s) I aVR V3 no significant change since 2014 Confirmed by Keyton Bhat   MD, Vanderbilt Ranieri ([id number]) on 06/24/2014 4:19:33 PM      MDM   Final diagnoses:  Fall, initial encounter  Subdural hematoma  Hand laceration, right, initial encounter    5:08 PM Radiology called with read on CT - bilateral subdurals, R>L. Chronic shift not related to this. Patient is awake and alert but somewhat disoriented as above, not worse than usual per NH. Will consult neurosurgery.  6:00 PM D/w Neurosurgery. They will come to eval patient but given age he is unlikely to be operative and thinks he will likely be able to be discharged.  7:20 PM Dr. Franky Macho to admit for close monitoring. Will close  right hand laceration loosely.  Audree Camel, MD 06/24/14 2010

## 2014-06-24 NOTE — H&P (Signed)
Curtis Fuller is an 80 y.o. male.   Chief Complaint: Acute Subdural hematoma HPI: whom fell today striking his head. Brought to the ED, head ct revealed bilateral acute subdural hematomas. Very little pressure being placed on brain secondary to generalized atrophy. Daughter has made it clear that she wants no operations if things were to change and I felt it was the recommended treatment. He has difficulty getting out of a chair, and this difficulty led to his falling today. He is amnestic for the fall.  Past Medical History  Diagnosis Date  . COPD (chronic obstructive pulmonary disease)   . Hypertension   . Environmental allergies   . BPH (benign prostatic hyperplasia)   . Memory loss   . Urinary incontinence     Past Surgical History  Procedure Laterality Date  . Prostatectomy    . Hernia repair      No family history on file. Social History:  reports that he has never smoked. He does not have any smokeless tobacco history on file. He reports that he does not drink alcohol or use illicit drugs.  Allergies:  Allergies  Allergen Reactions  . Tetanus Toxoids     unknown     (Not in a hospital admission)  Results for orders placed or performed during the hospital encounter of 06/24/14 (from the past 48 hour(s))  Basic metabolic panel     Status: Abnormal   Collection Time: 06/24/14  4:39 PM  Result Value Ref Range   Sodium 138 137 - 147 mEq/L   Potassium 4.0 3.7 - 5.3 mEq/L   Chloride 102 96 - 112 mEq/L   CO2 22 19 - 32 mEq/L   Glucose, Bld 105 (H) 70 - 99 mg/dL   BUN 29 (H) 6 - 23 mg/dL   Creatinine, Ser 1.21 0.50 - 1.35 mg/dL   Calcium 8.8 8.4 - 10.5 mg/dL   GFR calc non Af Amer 48 (L) >90 mL/min   GFR calc Af Amer 56 (L) >90 mL/min    Comment: (NOTE) The eGFR has been calculated using the CKD EPI equation. This calculation has not been validated in all clinical situations. eGFR's persistently <90 mL/min signify possible Chronic Kidney Disease.    Anion gap 14 5 -  15  CBC with Differential     Status: Abnormal   Collection Time: 06/24/14  4:39 PM  Result Value Ref Range   WBC 5.8 4.0 - 10.5 K/uL   RBC 4.09 (L) 4.22 - 5.81 MIL/uL   Hemoglobin 12.4 (L) 13.0 - 17.0 g/dL   HCT 37.0 (L) 39.0 - 52.0 %   MCV 90.5 78.0 - 100.0 fL   MCH 30.3 26.0 - 34.0 pg   MCHC 33.5 30.0 - 36.0 g/dL   RDW 16.0 (H) 11.5 - 15.5 %   Platelets 206 150 - 400 K/uL   Neutrophils Relative % 77 43 - 77 %   Neutro Abs 4.5 1.7 - 7.7 K/uL   Lymphocytes Relative 11 (L) 12 - 46 %   Lymphs Abs 0.6 (L) 0.7 - 4.0 K/uL   Monocytes Relative 9 3 - 12 %   Monocytes Absolute 0.5 0.1 - 1.0 K/uL   Eosinophils Relative 2 0 - 5 %   Eosinophils Absolute 0.1 0.0 - 0.7 K/uL   Basophils Relative 1 0 - 1 %   Basophils Absolute 0.0 0.0 - 0.1 K/uL  Protime-INR     Status: None   Collection Time: 06/24/14  4:39 PM  Result Value Ref  Range   Prothrombin Time 14.1 11.6 - 15.2 seconds   INR 1.08 0.00 - 1.49  APTT     Status: None   Collection Time: 06/24/14  4:39 PM  Result Value Ref Range   aPTT 34 24 - 37 seconds   Dg Shoulder Right  06/24/2014   CLINICAL DATA:  80 year old male with unwitnessed fall and bruising on lateral right shoulder  EXAM: RIGHT SHOULDER - 2+ VIEW  COMPARISON:  Prior chest x-ray 10/03/2013  FINDINGS: There is no evidence of fracture or dislocation. There is no evidence of arthropathy or other focal bone abnormality. Soft tissues are unremarkable.  IMPRESSION: Negative.   Electronically Signed   By: Jacqulynn Cadet M.D.   On: 06/24/2014 17:17   Dg Hip Complete Right  06/24/2014   CLINICAL DATA:  Unwitnessed fall. Initial encounter. The patient does not remember the fall. Hematoma above the right eye. Bruising lateral right shoulder and hip.  EXAM: RIGHT HIP - COMPLETE 2+ VIEW  COMPARISON:  None.  FINDINGS: There are significant degenerative changes in the hips bilaterally. No evidence for acute fracture or dislocation. Degenerative changes are seen in the lower lumbar  spine.  Bowel gas pattern is nonobstructive.  IMPRESSION: 1. Significant degenerative changes. 2. No evidence for acute  abnormality.   Electronically Signed   By: Shon Hale M.D.   On: 06/24/2014 17:19   Ct Head Wo Contrast  06/24/2014   CLINICAL DATA:  Unwitnessed fall, hematoma above right eye  EXAM: CT HEAD WITHOUT CONTRAST  TECHNIQUE: Contiguous axial images were obtained from the base of the skull through the vertex without intravenous contrast.  COMPARISON:  07/18/2013  FINDINGS: Right frontoparietal subdural hematoma, measuring up to 14 mm (series 3/image 18), with layering hematocrit level (series 3/ image 13).  Left frontoparietal subdural hematoma, measuring up to 8 mm (series 3/ image 22).  No evidence of parenchymal or intraventricular hemorrhage.  No mass effect. 4 mm rightward midline shift. Basal cisterns remain patent.  No evidence of acute infarction.  Very mild small vessel ischemic changes. Intracranial atherosclerosis.  Mild global cortical atrophy, age appropriate.  No ventriculomegaly.  Partial opacification of the bilateral ethmoid sinuses. Mild mucosal thickening of the bilateral maxillary sinuses. Chronic right sphenoid and frontal sinus opacification. Bilateral mastoid air cells are clear.  Extracranial hematoma overlying the right frontal bone, measuring up to 12 mm in thickness (series 3/ image 16).  No evidence of calvarial fracture.  IMPRESSION: Bilateral subdural hematomas, measuring up to 14 mm on the right and 8 mm on the left.  4 mm rightward midline shift. Basal cisterns remain patent. No evidence of intraventricular hemorrhage.  Extracranial hematoma overlying the right frontal bone, measuring 12 mm in thickness. No evidence of calvarial fracture.  Critical Value/emergent results were called by telephone at the time of interpretation on 06/24/2014 at 5:07 pm to Dr. Sherwood Gambler , who verbally acknowledged these results.   Electronically Signed   By: Julian Hy M.D.    On: 06/24/2014 17:07    Review of Systems  Constitutional: Negative.   HENT: Positive for hearing loss.   Respiratory: Negative.   Genitourinary: Negative.   Musculoskeletal: Positive for falls.  Skin: Negative.   Neurological: Positive for headaches.       Confusion  Endo/Heme/Allergies: Negative.   Psychiatric/Behavioral: Positive for memory loss.    Blood pressure 165/75, pulse 82, resp. rate 20, SpO2 99 %. Physical Exam  Constitutional: He appears well-developed.  HENT:  Large scalp  hematoma right forehead  Eyes: EOM are normal. Pupils are equal, round, and reactive to light.  Respiratory: Effort normal and breath sounds normal.  GI: Soft. Bowel sounds are normal.  Musculoskeletal: Normal range of motion.  Neurological: He is alert. No cranial nerve deficit.  Moving upper extremities and lower extremities well   Skin: Skin is warm and dry.  Psychiatric:  Confused, does know name Does not know year, day Does not understand situation Follows commands Is cooperative     Assessment/Plan Mr. Sposito  Admit for pain control and observation.  Will not repeat ct since no or.    Onika Gudiel L 06/24/2014, 7:24 PM

## 2014-06-24 NOTE — ED Notes (Signed)
Per EMS[facility]. Pt is not sure of events leading to fall. Pt found in chair sitting up by staff. Pt has hematoma above right eye and complaining of right shoulder pain. Pt is AO at baseline. Follows commands. Denies blood thinners. Pt noted to have elevation noted in V1-4. Denies CP. VSS.

## 2014-06-24 NOTE — Progress Notes (Signed)
eLink Physician-Brief Progress Note Patient Name: Curtis Fuller DOB: 10/19/1914 MRN: [id number]   Date of Service  06/24/2014  HPI/Events of Note  BL SDH, 4mm rt shift,   eICU Interventions  urinary retention requiring straight cath, pain controlled, BP ok, conservative mx   New ICU patient evaluation: [facility] I have reviewed relevant documentation including care plan & orders.   Intervention Category Evaluation Type: New Patient Evaluation  Demitri Kucinski V. 06/24/2014, 11:28 PM

## 2014-06-24 NOTE — ED Notes (Signed)
Pt to xray

## 2014-06-24 NOTE — ED Notes (Signed)
MD at bedside. 

## 2014-06-25 LAB — MRSA PCR SCREENING: MRSA by PCR: NEGATIVE

## 2014-06-25 NOTE — Plan of Care (Signed)
Problem: Phase I Progression Outcomes Goal: Adequate ventilation/oxygenation Outcome: Completed/Met Date Met:  06/25/14

## 2014-06-25 NOTE — Plan of Care (Signed)
Problem: Phase I Progression Outcomes Goal: Bedrest HOB at 30 degrees Outcome: Completed/Met Date Met:  06/25/14

## 2014-06-25 NOTE — Progress Notes (Signed)
[place] Will monitor for pt's arrival to unit.  Andrew AuVafiadis, Shirley Decamp I 06/25/2014 2:13 PM

## 2014-06-25 NOTE — Progress Notes (Signed)
Patient ID: Curtis Fuller, male   DOB: 01/29/1915, 80 y.o.   MRN: [id number] BP 142/78 mmHg  Pulse 73  Temp(Src) 98.2 F (36.8 C) (Oral)  Resp 32  SpO2 94% Alert, follows some commAnds Moving all extremities Hematoma still present Pain is much improved. Will need pt Transfer to floor.

## 2014-06-25 NOTE — Progress Notes (Signed)
Pt arrived to unit per wheelchair by [place], RN in 3MW.  Pt transferred to unit bed. No acute distress noted.  Assessment performed as charted.  Daughter at bedside. Will monitor closely.  Andrew AuVafiadis, Ebelyn Bohnet I 06/25/2014 3:25 PM

## 2014-06-25 NOTE — Plan of Care (Signed)
Problem: Consults Goal: Skin Care Protocol Initiated - if Braden Score 18 or less If consults are not indicated, leave blank or document N/A  Outcome: Completed/Met Date Met:  06/25/14

## 2014-06-25 NOTE — Plan of Care (Signed)
Problem: Phase I Progression Outcomes Goal: Voiding-avoid urinary catheter unless indicated Outcome: Not Applicable Date Met:  75/17/00

## 2014-06-25 NOTE — Plan of Care (Signed)
Problem: Phase I Progression Outcomes Goal: Hemodynamically stable Outcome: Progressing     

## 2014-06-26 MED ORDER — DIPHENHYDRAMINE HCL 50 MG/ML IJ SOLN
25.0000 mg | Freq: Three times a day (TID) | INTRAMUSCULAR | Status: DC | PRN
  Administered 2014-06-26: 25 mg via INTRAVENOUS
  Filled 2014-06-26: qty 1

## 2014-06-26 MED ORDER — DIAZEPAM 5 MG/ML IJ SOLN
2.0000 mg | Freq: Once | INTRAMUSCULAR | Status: AC
  Administered 2014-06-26: 2 mg via INTRAVENOUS
  Filled 2014-06-26: qty 2

## 2014-06-26 MED ORDER — MORPHINE SULFATE 2 MG/ML IJ SOLN
2.0000 mg | INTRAMUSCULAR | Status: DC | PRN
  Administered 2014-06-26 (×4): 2 mg via INTRAMUSCULAR
  Administered 2014-06-27 – 2014-06-29 (×13): 4 mg via INTRAMUSCULAR
  Filled 2014-06-26: qty 2
  Filled 2014-06-26: qty 1
  Filled 2014-06-26 (×2): qty 2
  Filled 2014-06-26: qty 1
  Filled 2014-06-26: qty 2
  Filled 2014-06-26: qty 1
  Filled 2014-06-26 (×8): qty 2
  Filled 2014-06-26: qty 1
  Filled 2014-06-26: qty 2

## 2014-06-26 MED ORDER — ALBUTEROL SULFATE (2.5 MG/3ML) 0.083% IN NEBU: 2.5000 mg | INHALATION_SOLUTION | RESPIRATORY_TRACT | Status: DC | PRN

## 2014-06-26 NOTE — Progress Notes (Signed)
Occupational Therapy Evaluation Patient Details Name: Ila Mcgilllmore Quackenbush MRN: [id number] DOB: 09/27/1914 Today's Date: 06/26/2014    History of Present Illness 80 y.o. male who fell today in his room at SNF striking his head. Brought to the ED, head ct revealed bilateral acute subdural hematomas.   Clinical Impression   PTA pt was living at ALF (per daughter) and was independent with ADLs, however has a history of multiple falls. She reports that he often does not use his RW in his room, leading to falls commonly. Pt is currently restless and confused, and holding his head moaning as if in pain. Attempted to provide comfort and relief to pt through. At present, pt requires total A for ADL due to decreased cognition and decreased cooperation. Pt would benefit from SNF and from acute OT to address independence with self-feeding, grooming, toilet transfers, and cognition.     Follow Up Recommendations  SNF;Supervision/Assistance - 24 hour    Equipment Recommendations  Other (comment) (defer to SNF)    Recommendations for Other Services       Precautions / Restrictions Precautions Precautions: Fall      Mobility Bed Mobility Overal bed mobility: Needs Assistance Bed Mobility: Supine to Sit     Supine to sit: Min guard;HOB elevated Sit to supine: Min assist;HOB elevated   General bed mobility comments: Pt needed no assistance to get OOB, however required min guard and some (A) due to impulsivity to prevent pt from getting injured.   Transfers Overall transfer level: Needs assistance Equipment used: 2 person hand held assist Transfers: Sit to/from Stand Sit to Stand: Min assist Stand pivot transfers: Mod assist       General transfer comment: Verbal and tactile cues for safety and sequencing. Pt is impulsive and restless.     Balance Overall balance assessment: Needs assistance Sitting-balance support: Single extremity supported;Feet supported Sitting balance-Leahy Scale:  Good     Standing balance support: Bilateral upper extremity supported;During functional activity Standing balance-Leahy Scale: Poor                              ADL Overall ADL's : Needs assistance/impaired                                       General ADL Comments: Pt restless and slightly agitated during OT session and appears to require Total A for ADLs due to decreased cognition and decreased cooperation. Pt is refusing food and will not swallow oral pills on command. Pt spit out food and pill. Pt appears restless, repeating "Wallis BambergFrieda" (daughter) and unintelligible words. Pt sounds like he is saying "beat it" and "leave me alone" and RN reports she has heard similar when they come to assist him. Pt was attempting to get OOB so assisted pt to toilet with 2 person hand held assist, then returned to recliner where he became lethargic and fell asleep. RN administered IM pain medicine injection to assist with HA.      Vision                 Additional Comments: Unable to assess due to decreased cognition and following commands.   Perception Perception Perception Tested?: No   Praxis Praxis Praxis tested?: Not tested    Pertinent Vitals/Pain Pain Assessment: Faces Faces Pain Scale: Hurts whole lot Pain Location: Pt holding his head  and moaning. Pt also covered with bruises Pain Intervention(s): Limited activity within patient's tolerance;Monitored during session;Repositioned;RN gave pain meds during session;Relaxation     Hand Dominance Right   Extremity/Trunk Assessment Upper Extremity Assessment Upper Extremity Assessment: Overall WFL for tasks assessed (pt with good strength throughout as noted during Hand held a)   Lower Extremity Assessment Lower Extremity Assessment: Defer to PT evaluation   Cervical / Trunk Assessment Cervical / Trunk Assessment: Kyphotic   Communication Communication Communication: Expressive difficulties    Cognition Arousal/Alertness: Awake/alert;Lethargic Behavior During Therapy: Restless Overall Cognitive Status: Impaired/Different from baseline Area of Impairment: Orientation;Attention;Memory;Following commands;Safety/judgement;Problem solving Orientation Level: Disoriented to;Person;Place;Time;Situation Current Attention Level: Focused Memory: Decreased recall of precautions;Decreased short-term memory Following Commands: Follows one step commands inconsistently Safety/Judgement: Decreased awareness of safety;Decreased awareness of deficits   Problem Solving: Slow processing;Difficulty sequencing;Requires verbal cues;Requires tactile cues                Home Living Family/patient expects to be discharged to:: Skilled nursing facility                                        Prior Functioning/Environment Level of Independence: Independent with assistive device(s)        Comments: frequent falls in room when not using RW    OT Diagnosis: Generalized weakness;Cognitive deficits;Acute pain   OT Problem List: Decreased strength;Impaired balance (sitting and/or standing);Decreased cognition;Decreased safety awareness;Pain   OT Treatment/Interventions: Self-care/ADL training;Therapeutic exercise;Energy conservation;DME and/or AE instruction;Therapeutic activities;Cognitive remediation/compensation;Patient/family education;Balance training    OT Goals(Current goals can be found in the care plan section) Acute Rehab OT Goals Patient Stated Goal: Pt's daughter states she would like him to get back to baseline OT Goal Formulation: With family Time For Goal Achievement: 07/10/14 Potential to Achieve Goals: Fair ADL Goals Pt Will Perform Eating: with set-up;with supervision;sitting Pt Will Perform Grooming: with set-up;with supervision;sitting Pt Will Transfer to Toilet: with min guard assist;ambulating Additional ADL Goal #1: Pt will follow 3/3 commands during ADL  tasks to increase independence with ADLs.   OT Frequency: Min 1X/week              End of Session Equipment Utilized During Treatment: Gait belt  Activity Tolerance: Patient tolerated treatment well Patient left: in chair;with call bell/phone within reach;with chair alarm set;with nursing/sitter in room;with family/visitor present   Time: [id number] OT Time Calculation (min): 35 min Charges:  OT General Charges $OT Visit: 1 Procedure OT Evaluation $Initial OT Evaluation Tier I: 1 Procedure OT Treatments $Self Care/Home Management : 8-22 mins  Nena JordanMiller, Elowyn Raupp M 06/26/2014, 2:52 PM  Carney LivingLeeAnn Marie Dailey Alberson, OTR/L Occupational Therapist [phone] (pager)

## 2014-06-26 NOTE — Plan of Care (Signed)
Problem: Phase II Progression Outcomes Goal: Tube feedings started Outcome: Not Applicable Date Met:  81/01/75

## 2014-06-26 NOTE — Progress Notes (Signed)
Patient noted to be restless and confused. Patient kept grabbing at his head and nurse medicated with PRN morphine. Safety sitter at the bedside. Family called to check on the patient and at the time he was sleep. Report given to oncoming RN Sian.

## 2014-06-26 NOTE — Plan of Care (Signed)
Problem: Consults Goal: Diabetes Guidelines if Diabetic/Glucose > 140 If diabetic or lab glucose is > 140 mg/dl - Initiate Diabetes/Hyperglycemia Guidelines & Document Interventions  Outcome: Not Applicable Date Met:  06/26/14     

## 2014-06-26 NOTE — Progress Notes (Signed)
Patient ID: Curtis Fuller, male   DOB: 01/31/1915, 80 y.o.   MRN: [id number] BP 157/81 mmHg  Pulse 106  Temp(Src) 99.1 F (37.3 C) (Axillary)  Resp 22  Ht 5' (1.524 m)  Wt 67.087 kg (147 lb 14.4 oz)  BMI 28.88 kg/m2  SpO2 97% Lethargic, easily aroused. Received benadryl this am Was walking this morning Pupils equal round and reactive Is moving all extremities Speech is not clear, not fluent Sleepy this morning. No ct since there will be no intervention.

## 2014-06-26 NOTE — Progress Notes (Signed)
MD has been agitated and restless.  Continues to try to get out of bed without assistance.  MD was notified and order to give 2mg  of Ativan received.  2mg  Ativan given to patient at 1208.  Patient still continues to be restless and agitated.  Still wants to get out of bed.  At this time, this RN is sitting in the patients room, preventing patient from getting out of bed and fall.  Will continue to monitor patient.

## 2014-06-26 NOTE — Evaluation (Signed)
Physical Therapy Evaluation Patient Details Name: Curtis Fuller MRN: [id number] DOB: 02/07/1915 Today's Date: 06/26/2014   History of Present Illness    80 y.o. male who fell today in his room at SNF striking his head. Brought to the ED, head ct revealed bilateral acute subdural hematomas.  Clinical Impression  Pt admitted with above. Pt currently with functional limitations due to the deficits listed below (see PT Problem List).  Pt will benefit from skilled PT to increase their independence and safety with mobility to allow discharge to the venue listed below.       Follow Up Recommendations SNF;Supervision/Assistance - 24 hour    Equipment Recommendations  None recommended by PT    Recommendations for Other Services       Precautions / Restrictions        Mobility  Bed Mobility Overal bed mobility: Needs Assistance Bed Mobility: Supine to Sit;Sit to Supine     Supine to sit: Min assist;HOB elevated Sit to supine: Min assist;HOB elevated      Transfers Overall transfer level: Needs assistance Equipment used: Rolling walker (2 wheeled) Transfers: Sit to/from UGI CorporationStand;Stand Pivot Transfers Sit to Stand: Mod assist Stand pivot transfers: Mod assist       General transfer comment: verbal/tactile cues for safety  Ambulation/Gait Ambulation/Gait assistance: Min assist;+2 safety/equipment Ambulation Distance (Feet): 15 Feet (x 2) Assistive device: Rolling walker (2 wheeled) Gait Pattern/deviations: Step-through pattern;Decreased stride length Gait velocity: WFL   General Gait Details: unsteady  Information systems managertairs            Wheelchair Mobility    Modified Rankin (Stroke Patients Only)       Balance Overall balance assessment: Needs assistance Sitting-balance support: Single extremity supported;Feet supported Sitting balance-Leahy Scale: Good     Standing balance support: Bilateral upper extremity supported;During functional activity Standing balance-Leahy  Scale: Poor                               Pertinent Vitals/Pain Pain Assessment: Faces Faces Pain Scale: Hurts even more Pain Location: right shoulder Pain Intervention(s): Monitored during session    Home Living Family/patient expects to be discharged to:: Skilled nursing facility                      Prior Function Level of Independence: Independent with assistive device(s)         Comments: frequent falls in room when not using RW     Hand Dominance        Extremity/Trunk Assessment   Upper Extremity Assessment: Defer to OT evaluation           Lower Extremity Assessment: Generalized weakness      Cervical / Trunk Assessment: Kyphotic  Communication   Communication: Expressive difficulties  Cognition Arousal/Alertness: Awake/alert Behavior During Therapy: Restless Overall Cognitive Status: Impaired/Different from baseline Area of Impairment: Orientation;Attention;Memory;Following commands;Safety/judgement;Problem solving Orientation Level: Disoriented to;Place;Time;Situation Current Attention Level: Focused Memory: Decreased recall of precautions;Decreased short-term memory Following Commands: Follows one step commands inconsistently Safety/Judgement: Decreased awareness of safety   Problem Solving: Slow processing;Difficulty sequencing;Requires verbal cues      General Comments      Exercises        Assessment/Plan    PT Assessment Patient needs continued PT services  PT Diagnosis Difficulty walking;Generalized weakness;Acute pain;Altered mental status   PT Problem List Decreased strength;Decreased activity tolerance;Decreased balance;Decreased mobility;Decreased coordination;Decreased knowledge of precautions;Pain;Decreased safety awareness;Decreased cognition  PT  Treatment Interventions DME instruction;Gait training;Functional mobility training;Therapeutic activities;Therapeutic exercise;Patient/family education;Cognitive  remediation;Balance training   PT Goals (Current goals can be found in the Care Plan section) Acute Rehab PT Goals Patient Stated Goal: unable to state PT Goal Formulation: Patient unable to participate in goal setting Time For Goal Achievement: 07/10/14 Potential to Achieve Goals: Fair    Frequency Min 2X/week   Barriers to discharge        Co-evaluation               End of Session Equipment Utilized During Treatment: Gait belt Activity Tolerance: Patient tolerated treatment well Patient left: in bed;with call bell/phone within reach;with bed alarm set Nurse Communication: Mobility status (RN present in room during eval.)         Time: [id number] PT Time Calculation (min) (ACUTE ONLY): 27 min   Charges:   PT Evaluation $Initial PT Evaluation Tier I: 1 Procedure PT Treatments $Gait Training: 8-22 mins $Therapeutic Activity: 8-22 mins   PT G Codes:          Curtis Fuller, Curtis Fuller 06/26/2014, 11:21 AM

## 2014-06-26 NOTE — Progress Notes (Signed)
Patient bladder scanned.  MD notified about urinary retention and received order to place urethral catheter.  Patient was agitated but is less restless since placement.  Will continue to monitor patient.

## 2014-06-27 NOTE — Progress Notes (Signed)
Patient ID: Curtis Fuller, male   DOB: 05/20/1915, 80 y.o.   MRN: [id number] BP 154/62 mmHg  Pulse 78  Temp(Src) 97.9 F (36.6 C) (Axillary)  Resp 18  Ht 5' (1.524 m)  Wt 67.087 kg (147 lb 14.4 oz)  BMI 28.88 kg/m2  SpO2 92% Currently restful, frequently agitated at other times Moves all extremities Will try for placement as soon as possible Stable exam.

## 2014-06-28 NOTE — Clinical Social Work Placement (Addendum)
Clinical Social Work Department CLINICAL SOCIAL WORK PLACEMENT NOTE 06/28/2014  Patient:  Curtis McgillFELTON,[name]  Account Number:  [id number] Admit date:  06/24/2014  Clinical Social Worker:  Derenda FennelBASHIRA Zurisadai Helminiak, CLINICAL SOCIAL WORKER  Date/time:  06/28/2014 04:07 PM  Clinical Social Work is seeking post-discharge placement for this patient at the following level of care:   SKILLED NURSING   (*CSW will update this form in Epic as items are completed)   06/28/2014  Patient/family provided with [facility] Department of Clinical Social Work's list of facilities offering this level of care within the geographic area requested by the patient (or if unable, by the patient's family).  06/28/2014  Patient/family informed of their freedom to choose among providers that offer the needed level of care, that participate in Medicare, Medicaid or managed care program needed by the patient, have an available bed and are willing to accept the patient.  06/28/2014  Patient/family informed of [facility]' ownership interest in [facility], as well as of the fact that they are under no obligation to receive care at this facility.  PASARR submitted to EDS on 06/28/2014 PASARR number received on 06/28/2014  FL2 transmitted to all facilities in geographic area requested by pt/family on  06/28/2014 FL2 transmitted to all facilities within larger geographic area on   Patient informed that his/her managed care company has contracts with or will negotiate with  certain facilities, including the following:   YES     Patient/family informed of bed offers received: 06/29/2014 [facility] Physician recommends and patient chooses bed at    Patient [facility]  on 06/30/2014 Patient to be transferred to facility by [facility]  Patient and family notified of transfer on 06/30/2014  Name of family member notified: Pt's daughter, Katrina StackFreda Crawford    The following physician request were entered in Epic:   Additional Comments:   Derenda FennelBashira Davarius Ridener, MSW, LCSWA [phone] 06/28/2014 4:08 PM

## 2014-06-28 NOTE — Clinical Social Work Psychosocial (Signed)
Clinical Social Work Department BRIEF PSYCHOSOCIAL ASSESSMENT 06/28/2014  Patient:  Curtis Fuller,Curtis Fuller     Account Number:  [id number]     Admit date:  06/24/2014  Clinical Social Worker:  Derenda FennelNIXON,Jeris Roser, CLINICAL SOCIAL WORKER  Date/Time:  06/28/2014 03:53 PM  Referred by:  Physician  Date Referred:  06/28/2014 Referred for  Psychosocial assessment   Other Referral:   Interview type:  Other - See comment Other interview type:   CSW spoke with pt's daughter, Curtis Fuller via telephone. [phone].    (NOTE: Pt's daughter contact information listed incorrectly in chart)  Correct cell number is listed above.    PSYCHOSOCIAL DATA Living Status:  FACILITY [facility] from [facility] [place] Level of care:  Assisted Living Primary support name:  Curtis Fuller [phone] Primary support relationship to patient:  CHILD, ADULT Degree of support available:   Strong    CURRENT CONCERNS Current Concerns  Post-Acute Placement   Other Concerns:    SOCIAL WORK ASSESSMENT / PLAN Clinical Social Worker spoke with pt's daughter via telephone in reference to post-acute placement for SNF. [facility]. Pt's daughter would like pt placed in SNF as MD/PT recommended. CSW explained SNF process and left SNF list in pt's room. Pt's daughter concerned about pt's medically condition. CSW advised pt's daughter to speak with MD/nurse for further medical information/updates. CSW to submit clinicals. CSW will continue to follow pt and pt's family for support and to facilitate pt's discharge needs once medically stable.   Assessment/plan status:  Psychosocial Support/Ongoing Assessment of Needs Other assessment/ plan:   Information/referral to community resources:   SNF list/ information provided.    PATIENT'S/FAMILY'S RESPONSE TO PLAN OF CARE: Pt lying in bed with sitter at bedside. Pt alert and disoriented. Pt's daughter reported wanting pt placed in  SNF for short-term placement. Pt's daughter also expressed wishes for [facility] to [facility] upon discharge from [facility]. Pt's daughter agreeable to SNF placement.    Derenda FennelBashira Katena Petitjean, MSW, LCSWA [phone] 06/28/2014 4:06 PM

## 2014-06-29 MED ORDER — MORPHINE SULFATE 10 MG RE SUPP
10.0000 mg | Freq: Four times a day (QID) | RECTAL | Status: DC | PRN
Start: 2014-06-29 — End: 2014-06-30
  Administered 2014-06-30 (×2): 10 mg via RECTAL
  Filled 2014-06-29 (×2): qty 1

## 2014-06-29 NOTE — Progress Notes (Signed)
Clinical Social Worker reviewed bed offers with pt's daughter, Lucille PassyFreda via telephone. Pt's daughter reported she will notify CSW once she makes a decision on which SNF she would like pt placed. CSW continues to follow for support and to facilitate pt's discharge needs once medically stable.   Derenda FennelBashira Veyda Kaufman, MSW, LCSWA [phone] 06/29/2014 10:29 AM

## 2014-06-29 NOTE — Progress Notes (Signed)
Chaplain introduced herself to pt and family. Services not needed at this time. Chaplain intends to follow. Page chaplain as needed.    06/29/14 1400  Clinical Encounter Type  Visited With Patient and family together;Health care provider  Visit Type Initial;Spiritual support  Referral From Nurse  Recommendations Follow Up  Spiritual Encounters  Spiritual Needs Prayer;Emotional  Charmian MuffStamey, Jenesa Foresta F, Chaplain 06/29/2014 3:00 PM

## 2014-06-29 NOTE — Plan of Care (Signed)
Problem: Phase II Progression Outcomes Goal: Discharge plan established Outcome: Progressing     

## 2014-06-29 NOTE — Plan of Care (Signed)
Problem: Phase II Progression Outcomes Goal: Bedrest HOB at 30 degrees Outcome: Completed/Met Date Met:  06/29/14

## 2014-06-29 NOTE — Plan of Care (Signed)
Problem: Phase II Progression Outcomes Goal: Pain controlled Outcome: Progressing                  

## 2014-06-29 NOTE — Progress Notes (Signed)
Patient ID: Curtis Fuller, male   DOB: 08/02/1915, 80 y.o.   MRN: [id number] BP 158/52 mmHg  Pulse 83  Temp(Src) 98.4 F (36.9 C) (Axillary)  Resp 18  Ht 5' (1.524 m)  Wt 67.087 kg (147 lb 14.4 oz)  BMI 28.88 kg/m2  SpO2 94% Alert , will speak at times No neurologic changes since admission Awaiting placement.

## 2014-06-29 NOTE — Progress Notes (Signed)
Patient ID: Curtis Fuller, male   DOB: 05/20/1915, 80 y.o.   MRN: [id number] BP 123/77 mmHg  Pulse 66  Temp(Src) 97.9 F (36.6 C) (Oral)  Resp 18  Ht 5' (1.524 m)  Wt 67.087 kg (147 lb 14.4 oz)  BMI 28.88 kg/m2  SpO2 95% Alert, following some commands Moving all extremities Perrl, full eom Moaning, will say that he hurts Will try morphine suppository-he refuses all po.

## 2014-06-29 NOTE — Progress Notes (Signed)
Occupational Therapy Treatment Patient Details Name: Curtis Fuller MRN: [id number] DOB: 12/05/1914 Today's Date: 06/29/2014    History of present illness 80 y.o. male who fell today in his room at SNF striking his head. Brought to the ED, head ct revealed bilateral acute subdural hematomas.   OT comments  Pt seen today to address cognition and self-care. Pt is increasingly agitated from last OT session and currently wearing Bil UE mittens due to safety. Pt attempting to get OOB and requiring +2 max assist for bed mobility and positioning. Pt was minimally participative in therapy with decreased following commands (cognition vs. Cooperation?). Pt will continue to benefit from SNF at d/c.    Follow Up Recommendations  SNF;Supervision/Assistance - 24 hour    Equipment Recommendations  Other (comment) (defer to SNF)    Recommendations for Other Services      Precautions / Restrictions Precautions Precautions: Fall Precaution Comments: Patient wearing bilat mitten restraints       Mobility Bed Mobility Overal bed mobility: Needs Assistance Bed Mobility: Supine to Sit;Sit to Supine     Supine to sit: +2 for safety/equipment;Max assist Sit to supine: Max assist;+2 for safety/equipment   General bed mobility comments: +2 for safety due to agitation. Pt attempting to get OOB. (A) to slide pt up and position.   Transfers Overall transfer level: Needs assistance Equipment used: 2 person hand held assist   Sit to Stand: +2 physical assistance;Total assist Stand pivot transfers: +2 physical assistance;Total assist       General transfer comment: NT        ADL Overall ADL's : Needs assistance/impaired                                       General ADL Comments: Pt appears increasingly agitated today with Bil mitten restraints. OT removed LUE restraint to check hand and pt attempted to strike OT with his RUE. Pt continues to be non-verbal during OT session with  the exception of saying "ow." Attempted to engage pt in self-care and following commands with minimal participation. RN attempted to feed pt small bite of applesauce to attempt to administer medication. Pt refused and spit out applesauce. RN reports that pt has had very little PO.                  Cognition  Arousal/Alertness: Awake/Alert Behavior During Therapy: Agitated;Restless Overall Cognitive Status: Impaired/Different from baseline Area of Impairment: Orientation;Attention;Memory;Following commands;Safety/judgement;Problem solving Orientation Level: Disoriented to;Person;Place;Time;Situation Current Attention Level: Focused Memory: Decreased recall of precautions;Decreased short-term memory  Following Commands: Follows one step commands inconsistently Safety/Judgement: Decreased awareness of safety;Decreased awareness of deficits   Problem Solving: Slow processing;Difficulty sequencing;Requires verbal cues;Requires tactile cues General Comments: difficult to assess;patient non verbal; will respond with a smile to the name "buddy"                 Pertinent Vitals/ Pain       Pain Assessment: Faces Faces Pain Scale: Hurts a little bit (pt saying "ow") Pain Intervention(s): Monitored during session;Repositioned;RN gave pain meds during session         Frequency Min 1X/week     Progress Toward Goals  OT Goals(current goals can now be found in the care plan section)  Progress towards OT goals: Not progressing toward goals - comment (due to agitation)     Plan Discharge plan remains appropriate  End of Session    Activity Tolerance Treatment limited secondary to agitation   Patient Left in bed;with call bell/phone within reach;with nursing/sitter in room   Nurse Communication          Time: [id number] OT Time Calculation (min): 10 min  Charges: OT General Charges $OT Visit: 1 Procedure OT Treatments $Self Care/Home Management : 8-22 mins  Rae LipsMiller,  Misheel Gowans M 06/29/2014, 11:49 AM  Carney LivingLeeAnn Marie Aleigha Gilani, OTR/L Occupational Therapist [phone] (pager)

## 2014-06-29 NOTE — Plan of Care (Signed)
Problem: Phase I Progression Outcomes Goal: Pain controlled with appropriate interventions Outcome: Completed/Met Date Met:  09-Dec-2013

## 2014-06-29 NOTE — Progress Notes (Signed)
Physical Therapy Treatment Patient Details Name: Curtis Fuller MRN: [id number] DOB: 09/26/1914 Today's Date: 06/29/2014    History of Present Illness 80 y.o. male who fell today in his room at SNF striking his head. Brought to the ED, head ct revealed bilateral acute subdural hematomas.    PT Comments    Patient with marked weakness than on evaluation. Spoke with patient daughter who stated that he had not been eating much. She wanted to make sure the patient was comfortable but did want PT to continue to work with him as he was able. Patient unable to attempt ambulation this session and was +2 total assist for OOB to recliner. Not much verbal response from patient other than moaning out.   Follow Up Recommendations  SNF;Supervision/Assistance - 24 hour     Equipment Recommendations  None recommended by PT    Recommendations for Other Services       Precautions / Restrictions Precautions Precautions: Fall Precaution Comments: Patient wearing bilat mitten restraints    Mobility  Bed Mobility         Supine to sit: +2 for safety/equipment;Max assist     General bed mobility comments: +2 total to facilitate positioning to EOB. A with all aspects. Patient with posterior lean  Transfers Overall transfer level: Needs assistance Equipment used: 2 person hand held assist   Sit to Stand: +2 physical assistance;Total assist Stand pivot transfers: +2 physical assistance;Total assist       General transfer comment: Patient required +2 total A this session with SPT. Unable to stand fully upright. Use of chuck pad to assist patient to chair as well.   Ambulation/Gait                 Stairs            Wheelchair Mobility    Modified Rankin (Stroke Patients Only)       Balance     Sitting balance-Leahy Scale: Poor Sitting balance - Comments: Need one person assist to maintain balance EOB Postural control: Posterior lean   Standing balance-Leahy Scale:  Zero                      Cognition Arousal/Alertness: Awake/alert;Lethargic Behavior During Therapy: Restless Overall Cognitive Status: Impaired/Different from baseline Area of Impairment: Orientation;Attention;Memory;Following commands;Safety/judgement;Problem solving Orientation Level: Disoriented to;Person;Place;Time;Situation Current Attention Level: Focused Memory: Decreased recall of precautions;Decreased short-term memory Following Commands: Follows one step commands inconsistently Safety/Judgement: Decreased awareness of safety;Decreased awareness of deficits   Problem Solving: Slow processing;Difficulty sequencing;Requires verbal cues;Requires tactile cues General Comments: difficult to assess;patient non verbal; will respond with a smile to the name "buddy"    Exercises      General Comments        Pertinent Vitals/Pain Pain Assessment: Faces Faces Pain Scale: No hurt    Home Living                      Prior Function            PT Goals (current goals can now be found in the care plan section) Progress towards PT goals: Not progressing toward goals - comment    Frequency  Min 2X/week    PT Plan Current plan remains appropriate    Co-evaluation             End of Session   Activity Tolerance: Treatment limited secondary to agitation;Patient limited by fatigue Patient left: in chair;with call bell/phone within  reach;with chair alarm set     Time: [phone] PT Time Calculation (min) (ACUTE ONLY): 15 min  Charges:  $Therapeutic Activity: 8-22 mins                    G Codes:      Fredrich BirksRobinette, Julia Elizabeth 06/29/2014, 9:32 AM 06/29/2014 Fredrich Birksobinette, Julia Elizabeth PTA [phone] pager [phone] office

## 2014-06-30 NOTE — Discharge Summary (Signed)
Physician Discharge Summary  Patient ID: Curtis Fuller MRN: [id number] DOB/AGE: 78/12/1914 78 y.o.  Admit date: 06/24/2014 Discharge date: 06/30/2014  Admission Diagnoses:acute subdural hematoma  Discharge Diagnoses:  Active Problems:   Subdural hematoma   Discharged Condition: fair  Hospital Course: Mr. Drucie OpitzFelton was admitted after falling at his nursing home. He sustained a large scalp hematoma and an acute subdural hematoma. His daughter reasonably stated that she wanted no surgical intervention. He did not have any evidence of herniation, and the ct scan showed a brain which accommodate the small amount of blood. He has been moving all extremities, will follow some commands, and will speak at times.   Consults: None  Significant Diagnostic Studies: none  Treatments: IV hydration  Discharge Exam: Blood pressure 170/50, pulse 78, temperature 98.6 F (37 C), temperature source Axillary, resp. rate 18, height 5' (1.524 m), weight 67.087 kg (147 lb 14.4 oz), SpO2 90 %. as above  Disposition: 01-Home or Self Care     Medication List    TAKE these medications        acetaminophen 500 MG tablet  Commonly known as:  TYLENOL  Take 500 mg by mouth every 6 (six) hours as needed. For joint pain     albuterol-ipratropium 18-103 MCG/ACT inhaler  Commonly known as:  COMBIVENT  Inhale 1 puff into the lungs 4 (four) times daily.     amitriptyline 10 MG tablet  Commonly known as:  ELAVIL  Take 10 mg by mouth at bedtime.     amoxicillin 500 MG capsule  Commonly known as:  AMOXIL  Take 2,000 mg by mouth daily as needed. 1 hour prior to dental appointment. Unknown start date.     finasteride 5 MG tablet  Commonly known as:  PROSCAR  Take 5 mg by mouth daily.     fluconazole 100 MG tablet  Commonly known as:  DIFLUCAN  Take 100 mg by mouth 1 day or 1 dose. Takes 1 tablet monthly on the 15th of every month     Fluticasone-Salmeterol 250-50 MCG/DOSE Aepb  Commonly known as:   ADVAIR  Inhale 1 puff into the lungs daily.     meloxicam 7.5 MG tablet  Commonly known as:  MOBIC  Take 7.5 mg by mouth daily. Take with food     montelukast 10 MG tablet  Commonly known as:  SINGULAIR  Take 10 mg by mouth daily.     multivitamins with iron Tabs tablet  Take 1 tablet by mouth daily.     mupirocin ointment 2 %  Commonly known as:  BACTROBAN  Place 1 application into the nose 2 (two) times daily.     PRESERVISION AREDS PO  Take 2 capsules by mouth daily.     SIMBRINZA 1-0.2 % Susp  Generic drug:  Brinzolamide-Brimonidine  Place 1 drop into both eyes 2 (two) times daily.     sodium chloride 0.65 % Soln nasal spray  Commonly known as:  OCEAN  Place 2 sprays into both nostrils as needed for congestion (nasal dryness).     triamterene-hydrochlorothiazide 37.5-25 MG per tablet  Commonly known as:  MAXZIDE-25  Take 0.5 tablets by mouth daily.         Signed: Nykayla Marcelli L 06/30/2014, 2:18 PM

## 2014-06-30 NOTE — Progress Notes (Signed)
Discharge orders received.  [facility].  VSS.  Pt dressed and awaiting [facility] for transport.   Will continue to monitor. Sondra ComeSilva, Hans Rusher M, RN

## 2014-06-30 NOTE — Clinical Social Work Note (Signed)
Clinical Social Worker facilitated patient discharge including contacting patient family and facility to confirm patient discharge plans.  Clinical information faxed to facility and family agreeable with plan.  CSW arranged ambulance transport via [facility].  RN to call report prior to discharge.  Clinical Social Worker will sign off for now as social work intervention is no longer needed. Please consult us again if new need arises.  Curtis Fuller, MSW, LCSWA [phone] 06/30/2014 2:55 PM

## 2014-06-30 NOTE — Progress Notes (Signed)
Pt transported via [facility].    Saleh Ulbrich M, RN 

## 2014-06-30 NOTE — Progress Notes (Signed)
Pt O2 level 82%, placed on 2L oxygen, 96%.  Will continue to monitor. Sondra ComeSilva, Doris Mcgilvery M, RN

## 2014-06-30 NOTE — Progress Notes (Signed)
Clinical Social Worker spoke with pt's family who reported [facility]. CSW continues to provide support and facilitate pt's discharge needs once medically stable.   Derenda FennelBashira Ilian Wessell, MSW, LCSWA [phone] 06/30/2014 10:00 AM

## 2014-06-30 NOTE — Plan of Care (Signed)
Problem: Phase I Progression Outcomes Goal: Hemodynamically stable Outcome: Completed/Met Date Met:  06/30/14

## 2014-07-12 DEATH — deceased

## 2015-03-23 IMAGING — CR DG SHOULDER 2+V*R*
2 series · 2 of 2 positions shown · non-contrast
Comparison: Prior chest x-ray 10/03/2013

CLINICAL DATA: [AGE] male with unwitnessed fall and bruising
on lateral right shoulder

EXAM:
RIGHT SHOULDER - 2+ VIEW

[t shoulder internal right]
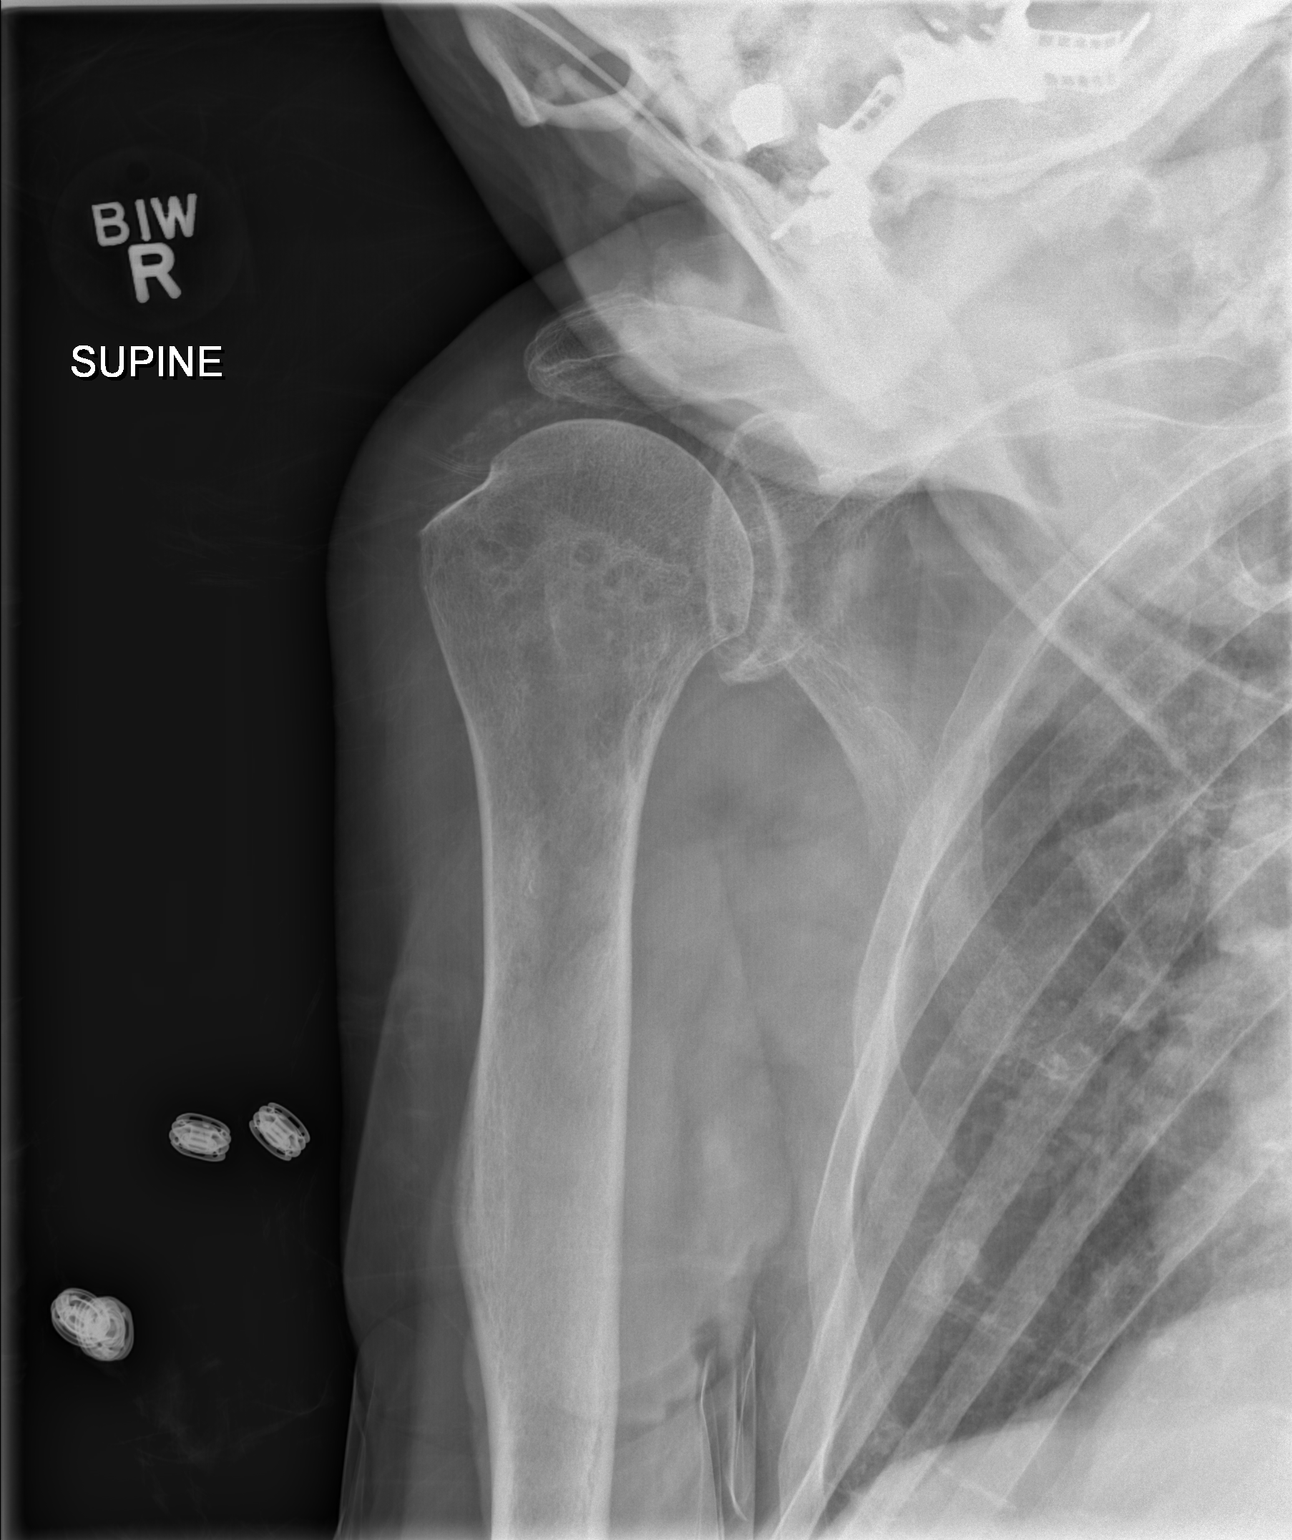

[t shoulder y-view right]
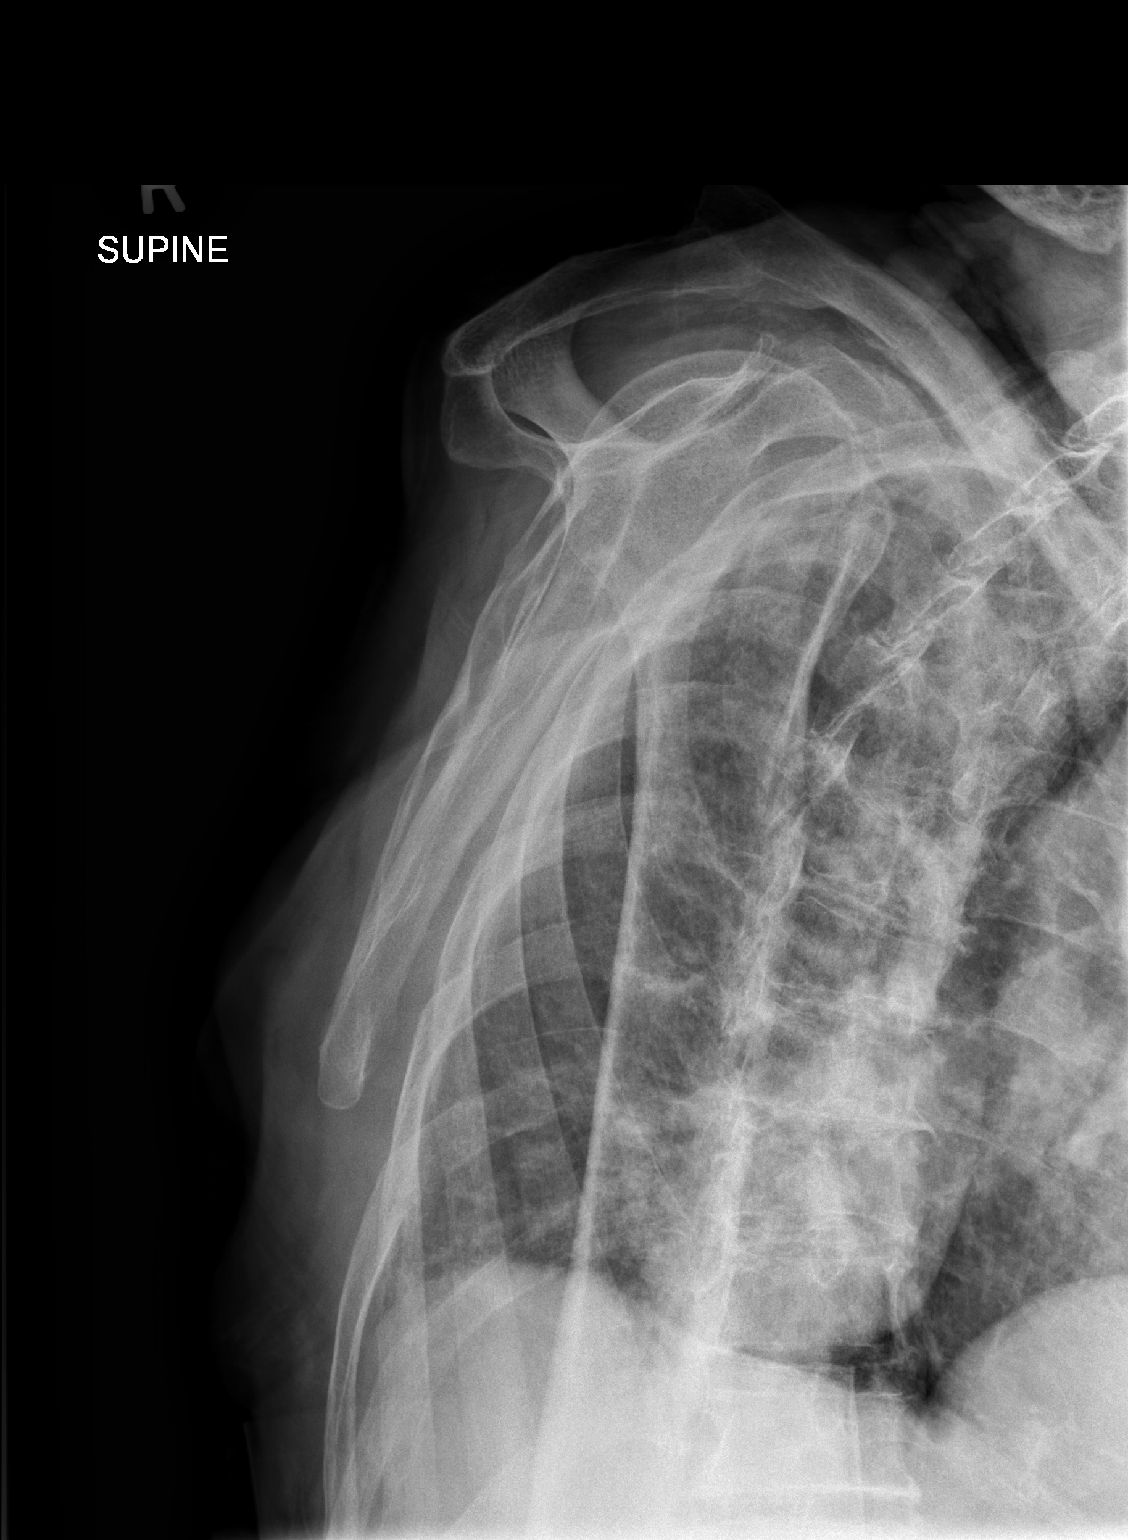

[2 of 2 positions shown; findings below may reference images not displayed]

FINDINGS: There is no evidence of fracture or dislocation. There is no
evidence of arthropathy or other focal bone abnormality. Soft
tissues are unremarkable.
IMPRESSION: Negative.

## 2015-03-23 IMAGING — CR DG HIP COMPLETE 2+V*R*
3 series · 3 of 3 positions shown · non-contrast
Comparison: None.

CLINICAL DATA: Unwitnessed fall. Initial encounter. The patient
does not remember the fall. Hematoma above the right eye. Bruising
lateral right shoulder and hip.

EXAM:
RIGHT HIP - COMPLETE 2+ VIEW

[t pelvis ap]
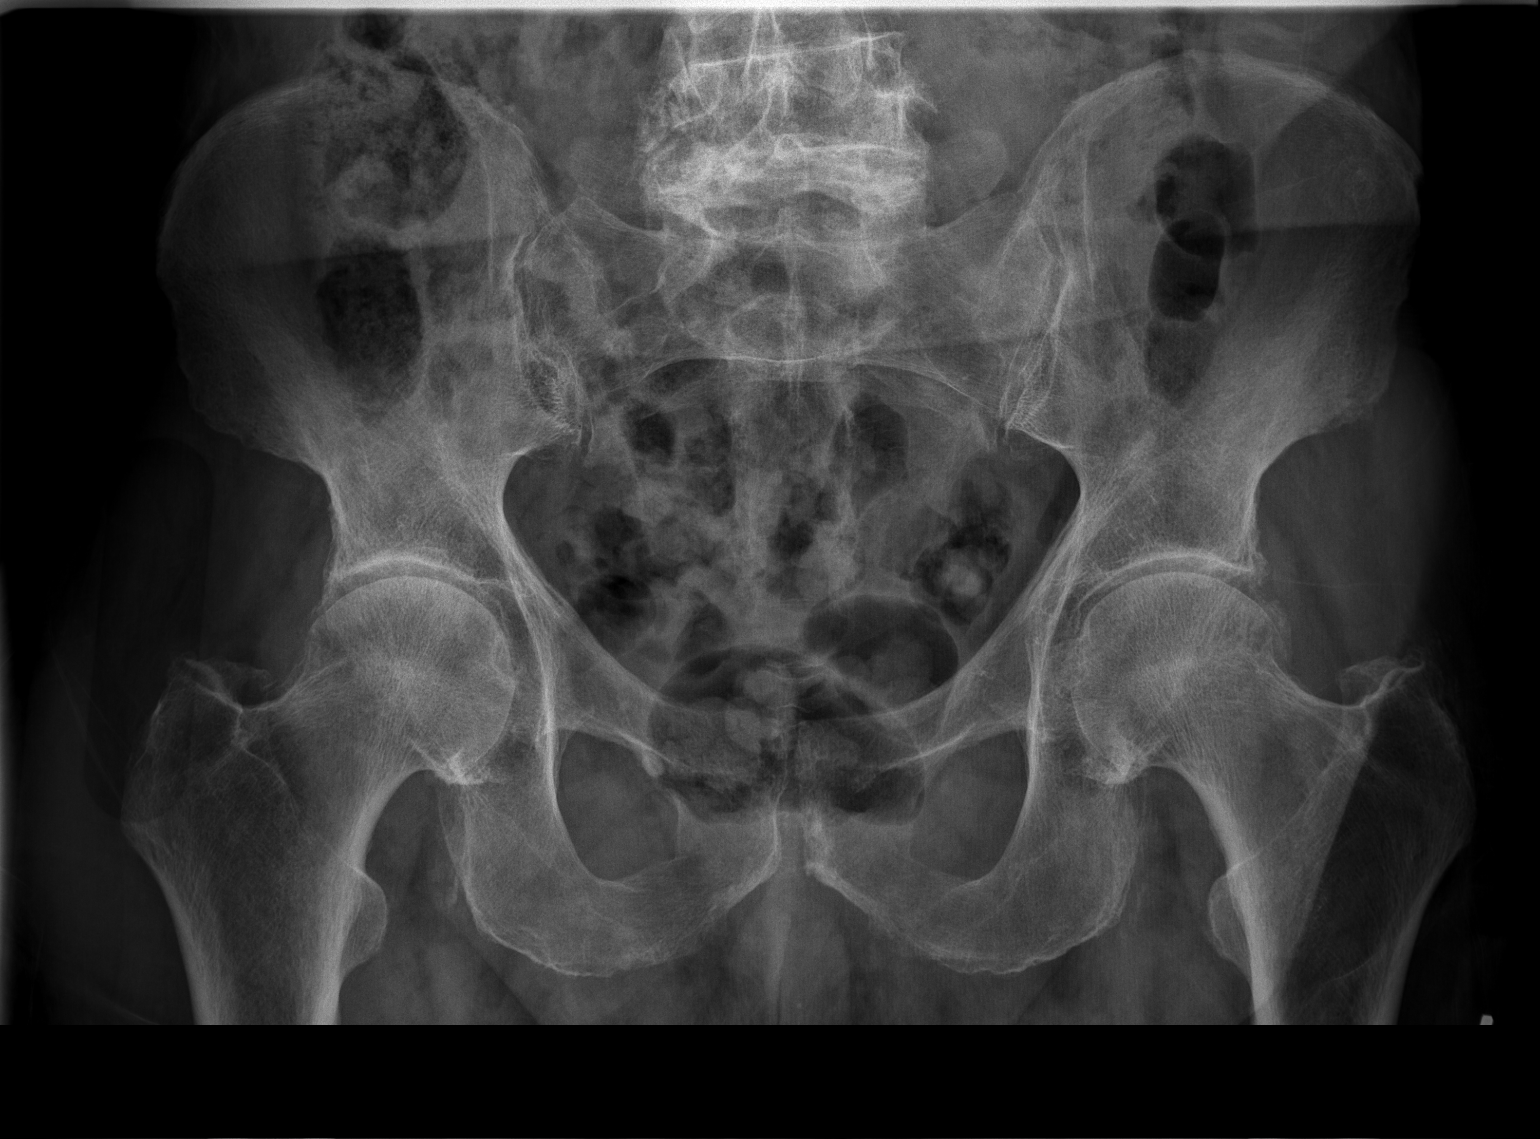

[t hip ap right]
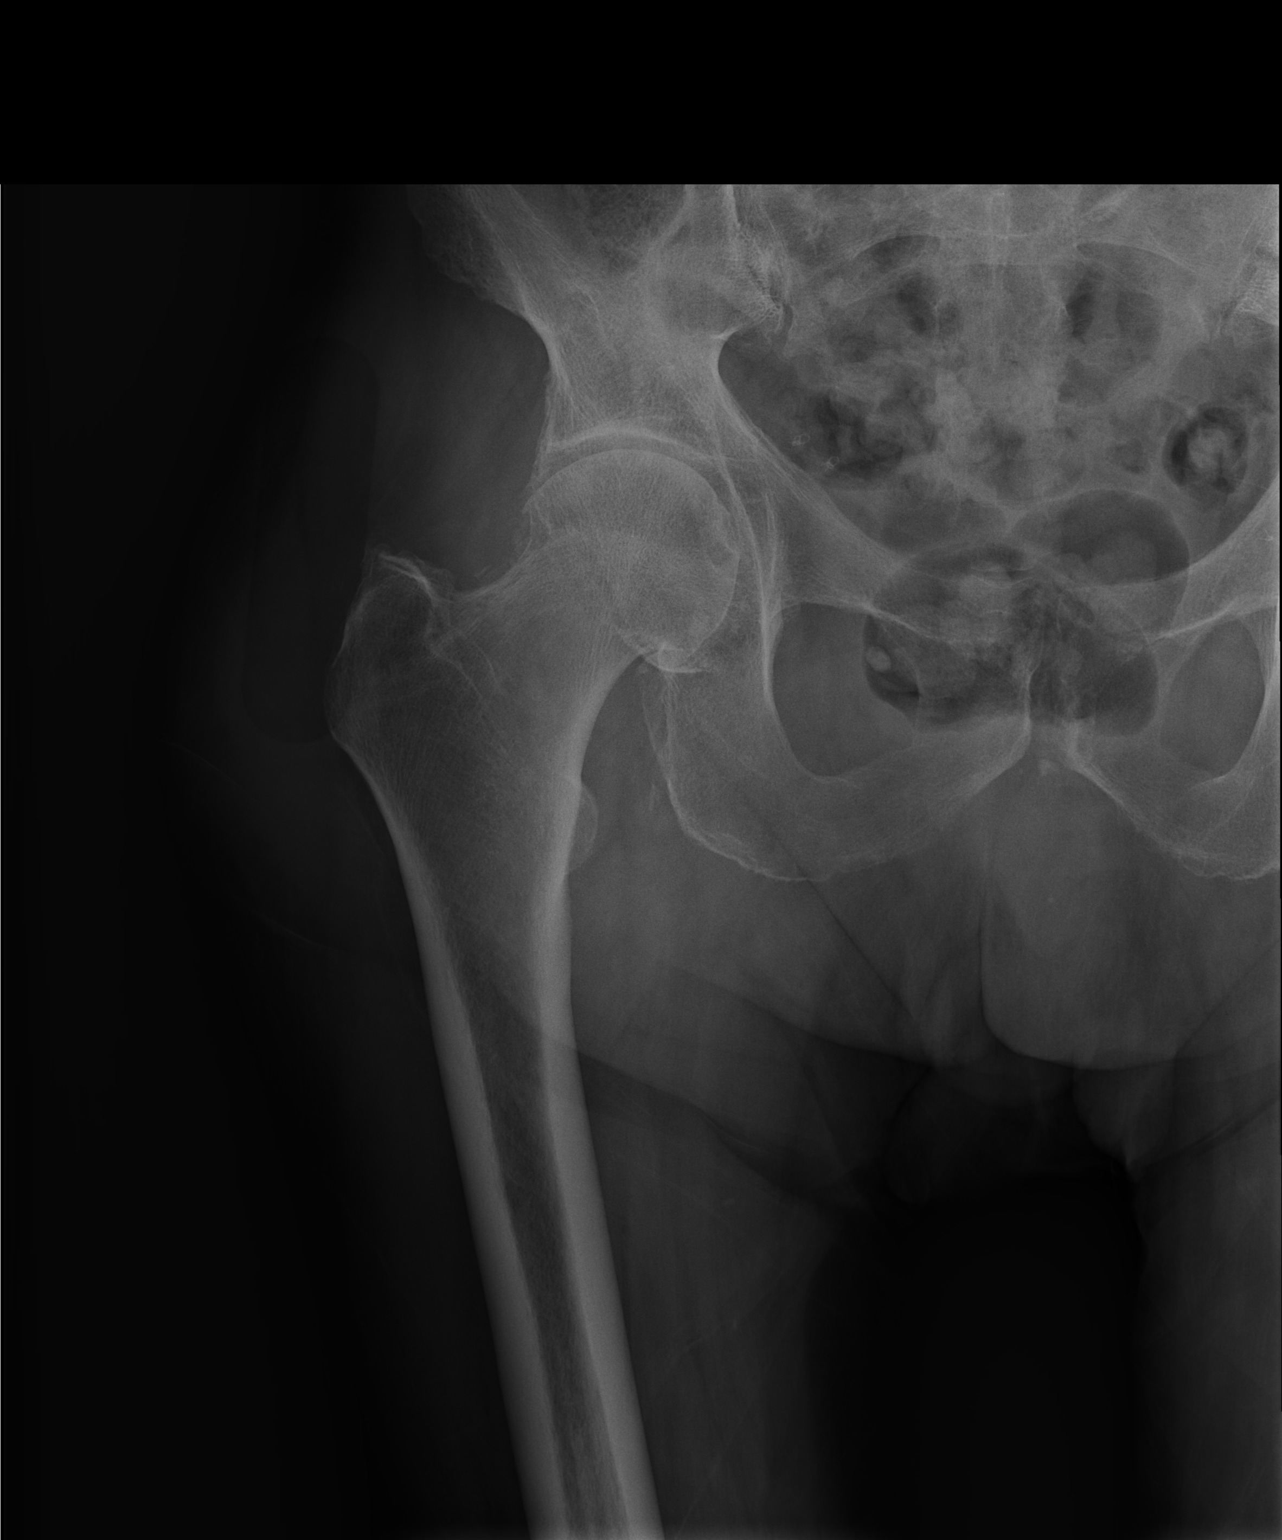

[t hip frog leg right]
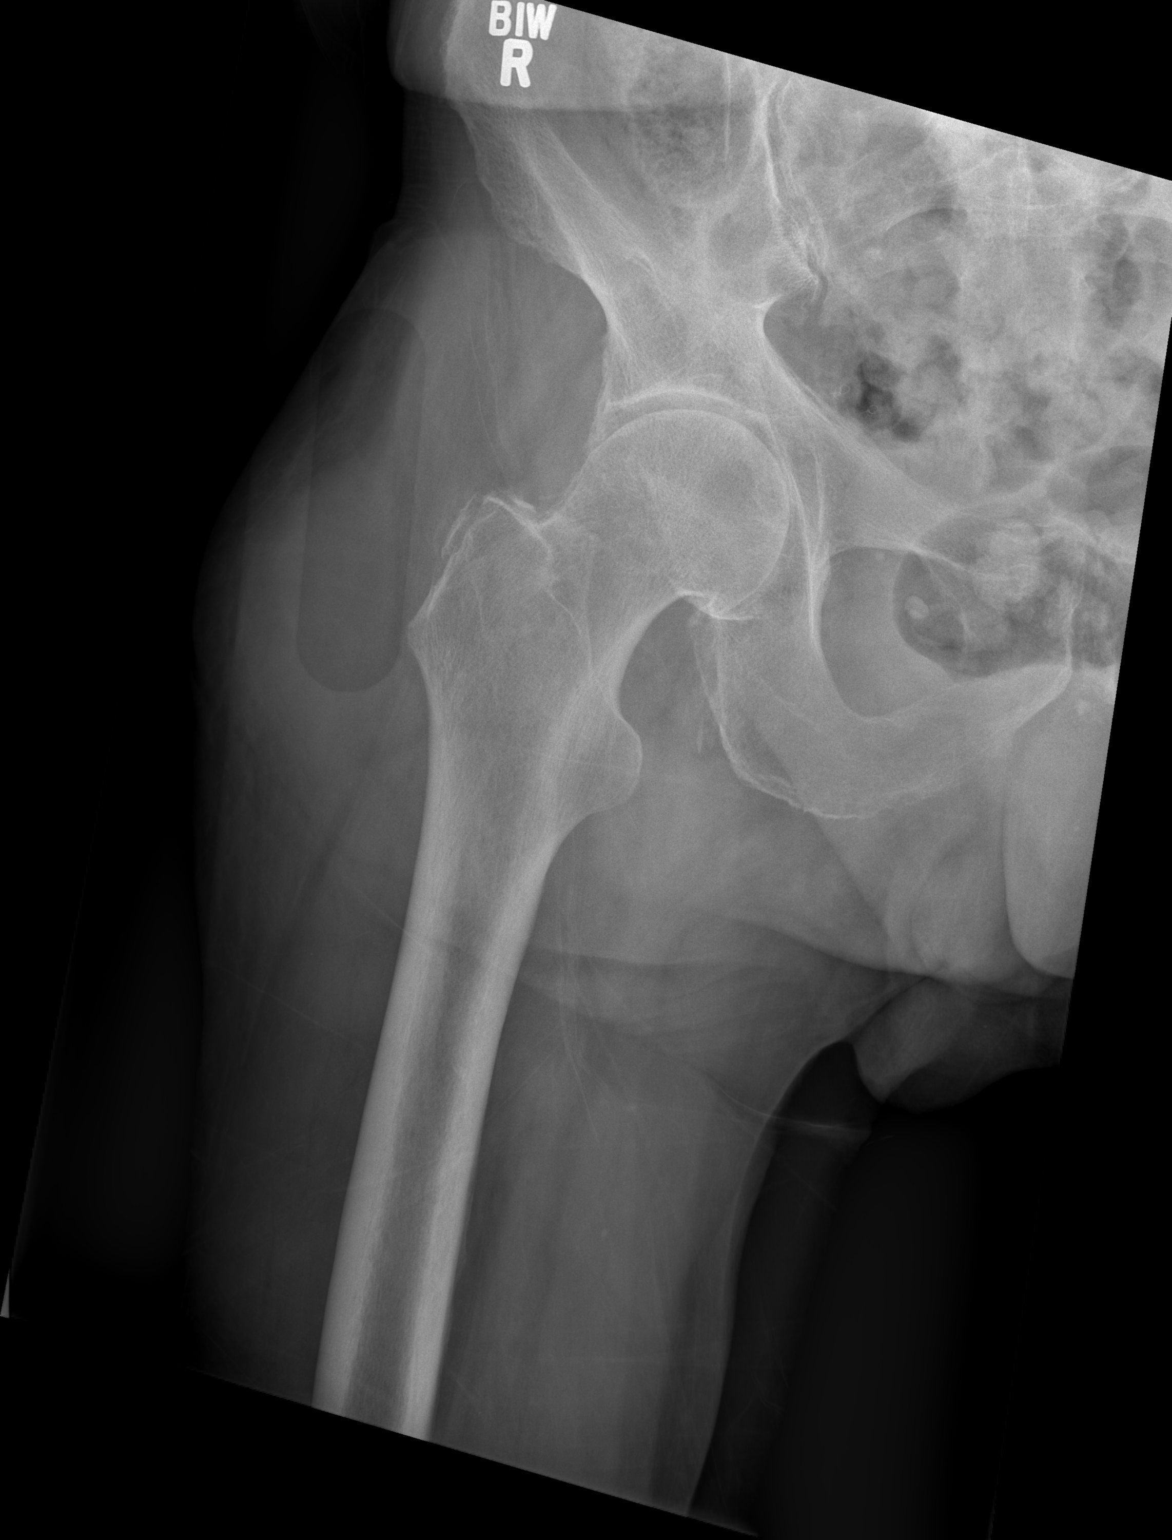

[3 of 3 positions shown; findings below may reference images not displayed]

FINDINGS: There are significant degenerative changes in the hips bilaterally.
No evidence for acute fracture or dislocation. Degenerative changes
are seen in the lower lumbar spine.

Bowel gas pattern is nonobstructive.
IMPRESSION: 1. Significant degenerative changes.
2. No evidence for acute  abnormality.
